# Patient Record
Sex: Female | Born: 1960 | Race: White | Hispanic: No | Marital: Married | Smoking: Former smoker
Health system: Southern US, Community
[De-identification: ages and names within clinical notes are randomized; demographics above are authoritative.]

## PROBLEM LIST (undated history)

## (undated) ENCOUNTER — Emergency Department (HOSPITAL_COMMUNITY): Discharge status: Absent without leave | Payer: Self-pay

## (undated) ENCOUNTER — Emergency Department (HOSPITAL_COMMUNITY): Admission: EM | Payer: Self-pay | Source: Home / Self Care

## (undated) DIAGNOSIS — N200 Calculus of kidney: Secondary | ICD-10-CM

## (undated) DIAGNOSIS — E669 Obesity, unspecified: Secondary | ICD-10-CM

## (undated) DIAGNOSIS — M779 Enthesopathy, unspecified: Secondary | ICD-10-CM

## (undated) DIAGNOSIS — I10 Essential (primary) hypertension: Secondary | ICD-10-CM

## (undated) DIAGNOSIS — R252 Cramp and spasm: Secondary | ICD-10-CM

## (undated) DIAGNOSIS — E7800 Pure hypercholesterolemia, unspecified: Secondary | ICD-10-CM

## (undated) DIAGNOSIS — K219 Gastro-esophageal reflux disease without esophagitis: Secondary | ICD-10-CM

## (undated) HISTORY — DX: Gastro-esophageal reflux disease without esophagitis: K21.9

## (undated) HISTORY — DX: Pure hypercholesterolemia, unspecified: E78.00

## (undated) HISTORY — PX: TUBAL LIGATION: SHX77

## (undated) HISTORY — DX: Calculus of kidney: N20.0

## (undated) HISTORY — DX: Enthesopathy, unspecified: M77.9

## (undated) HISTORY — PX: WISDOM TOOTH EXTRACTION: SHX21

---

## 2012-08-03 ENCOUNTER — Ambulatory Visit: Payer: BC Managed Care – PPO

## 2012-08-03 ENCOUNTER — Ambulatory Visit: Payer: BC Managed Care – PPO | Admitting: Family Medicine

## 2012-08-03 VITALS — BP 152/94 | HR 80 | Temp 98.1°F | Resp 16 | Ht 66.0 in | Wt 207.0 lb

## 2012-08-03 DIAGNOSIS — R1031 Right lower quadrant pain: Secondary | ICD-10-CM

## 2012-08-03 DIAGNOSIS — M549 Dorsalgia, unspecified: Secondary | ICD-10-CM

## 2012-08-03 DIAGNOSIS — N390 Urinary tract infection, site not specified: Secondary | ICD-10-CM

## 2012-08-03 LAB — POCT URINALYSIS DIPSTICK
Glucose, UA: NEGATIVE
Ketones, UA: 15
Nitrite, UA: NEGATIVE
Protein, UA: 300
Spec Grav, UA: >=1.03
Urobilinogen, UA: 0.2
pH, UA: 5.5

## 2012-08-03 LAB — POCT CBC
Granulocyte percent: 62.1 % (ref 37–80)
HCT, POC: 47.8 % (ref 37.7–47.9)
Hemoglobin: 15.1 g/dL (ref 12.2–16.2)
Lymph, poc: 4.1 — AB (ref 0.6–3.4)
MCH, POC: 30 pg (ref 27–31.2)
MCHC: 31.6 g/dL — AB (ref 31.8–35.4)
MCV: 94.9 fL (ref 80–97)
MID (cbc): 1 — AB (ref 0–0.9)
MPV: 8.7 fL (ref 0–99.8)
POC Granulocyte: 8.4 — AB (ref 2–6.9)
POC LYMPH PERCENT: 30.2 %L (ref 10–50)
POC MID %: 7.7 % (ref 0–12)
Platelet Count, POC: 402 10*3/uL (ref 142–424)
RBC: 5.04 M/uL (ref 4.04–5.48)
RDW, POC: 13.2 %
WBC: 13.6 10*3/uL — AB (ref 4.6–10.2)

## 2012-08-03 LAB — POCT UA - MICROSCOPIC ONLY
Amorphous: POSITIVE
Casts, Ur, LPF, POC: NEGATIVE
Crystals, Ur, HPF, POC: NEGATIVE
Mucus, UA: POSITIVE
Yeast, UA: NEGATIVE

## 2012-08-03 MED ORDER — OXYCODONE-ACETAMINOPHEN 5-325 MG PO TABS: 1.0000 | ORAL_TABLET | Freq: Three times a day (TID) | ORAL | Status: DC | PRN

## 2012-08-03 MED ORDER — IBUPROFEN 800 MG PO TABS: 800.0000 mg | ORAL_TABLET | Freq: Three times a day (TID) | ORAL | Status: DC | PRN

## 2012-08-03 MED ORDER — CIPROFLOXACIN HCL 500 MG PO TABS: 500.0000 mg | ORAL_TABLET | Freq: Two times a day (BID) | ORAL | Status: DC

## 2012-08-03 NOTE — Progress Notes (Signed)
[facility]:  Office Visit  Chief Complaint:  Chief Complaint  Patient presents with  . Flank Pain    Lt sided- all began yesterday- took ibuprofen about 3am and 10am today  . Abdominal Pain    across low abdomen  . Emesis    HPI: Gloria Martin is a 52 y.o. female who complains of  Left side back pain, low abdominal pain . Used scalding hot water in bath tub and laying on heating pad. YEsterday morning. Emesis. She works around [employer]. Deneis diarrhea. Pure clear liquid. Took IBuprofen with some relief. Deneis fevers, hematuria, melena. Feels like menstrual camps. LMP currently. Feels regular period. Occasionally will pass clots. August, Spet, OCtober did not have menses.   History reviewed. No pertinent past medical history. Past Surgical History  Procedure Date  . Tubal ligation    History   Social History  . Marital Status: Unknown    Spouse Name: N/A    Number of Children: N/A  . Years of Education: N/A   Social History Main Topics  . Smoking status: Current Every Day Smoker  . Smokeless tobacco: None  . Alcohol Use: No  . Drug Use: No  . Sexually Active: None   Other Topics Concern  . None   Social History Narrative  . None   Family History  Problem Relation Age of Onset  . Hypertension Mother   . Hypertension Brother    No Known Allergies Prior to Admission medications   Not on File     ROS: The patient denies fevers, chills, night sweats, unintentional weight loss, chest pain, palpitations, wheezing, dyspnea on exertion,  dysuria, hematuria, melena, numbness, weakness, or tingling.  All other systems have been reviewed and were otherwise negative with the exception of those mentioned in the HPI and as above.    PHYSICAL EXAM: Filed Vitals:   08/03/12 1432  BP: 152/94  Pulse: 80  Temp: 98.1 F (36.7 C)  Resp: 16   Filed Vitals:   08/03/12 1432  Height: 5\' 6"  (1.676 m)  Weight: 207 lb (93.895 kg)   Body mass index is  33.41 kg/(m^2).  General: Alert, no acute distress HEENT:  Normocephalic, atraumatic, oropharynx patent.  Cardiovascular:  Regular rate and rhythm, no rubs murmurs or gallops.  No Carotid bruits, radial pulse intact. No pedal edema.  Respiratory: Clear to auscultation bilaterally.  No wheezes, rales, or rhonchi.  No cyanosis, no use of accessory musculature GI: No organomegaly, abdomen is soft and mild left side abd-tender, positive bowel sounds.  No masses. Skin: No rashes. Neurologic: Facial musculature symmetric. Psychiatric: Patient is appropriate throughout our interaction. Lymphatic: No cervical lymphadenopathy Musculoskeletal: Gait intact. No Cva tenderness,    LABS: Results for orders placed in visit on 08/03/12  POCT CBC      Component Value Range   WBC 13.6 (*) 4.6 - 10.2 K/uL   Lymph, poc 4.1 (*) 0.6 - 3.4   POC LYMPH PERCENT 30.2  10 - 50 %L   MID (cbc) 1.0 (*) 0 - 0.9   POC MID % 7.7  0 - 12 %M   POC Granulocyte 8.4 (*) 2 - 6.9   Granulocyte percent 62.1  37 - 80 %G   RBC 5.04  4.04 - 5.48 M/uL   Hemoglobin 15.1  12.2 - 16.2 g/dL   HCT, POC 14.7  82.9 - 47.9 %   MCV 94.9  80 - 97 fL   MCH, POC 30.0  27 -  31.2 pg   MCHC 31.6 (*) 31.8 - 35.4 g/dL   RDW, POC 40.9     Platelet Count, POC 402  142 - 424 K/uL   MPV 8.7  0 - 99.8 fL  POCT UA - MICROSCOPIC ONLY      Component Value Range   WBC, Ur, HPF, POC 2-11     RBC, urine, microscopic tntc     Bacteria, U Microscopic 1+     Mucus, UA pos     Epithelial cells, urine per micros 2-6     Crystals, Ur, HPF, POC neg     Casts, Ur, LPF, POC neg     Yeast, UA neg     Amorphous pos    POCT URINALYSIS DIPSTICK      Component Value Range   Color, UA orange     Clarity, UA cloudy     Glucose, UA neg     Bilirubin, UA mod     Ketones, UA 15     Spec Grav, UA >=1.030     Blood, UA large     pH, UA 5.5     Protein, UA 300     Urobilinogen, UA 0.2     Nitrite, UA neg     Leukocytes, UA Trace       EKG/XRAY:     Primary read interpreted by Dr. Conley Rolls at [facility]. No obstruction/no free air, ? Renal stone   ASSESSMENT/PLAN: Encounter Diagnoses  Name Primary?  . Back pain Yes  . Abdominal pain, acute, bilateral lower quadrant   . UTI (lower urinary tract infection)    Renal stone vs UTI vs possibly combination of both. She is having left sided flank pain but there is a possible right sided renal stone Rx Cipro 500 mg BID x 10 Days, urine cx Rx Ibuprofen 800 mg q8 hrs with food prn Wrote rx for percocet 5/325 mg #20.  Push fluids F/u prn    Iviana Blasingame PHUONG, DO 08/03/2012 4:01 PM

## 2012-08-04 LAB — URINE CULTURE: Colony Count: >=100000

## 2012-10-02 ENCOUNTER — Telehealth: Payer: Self-pay

## 2012-10-02 NOTE — Telephone Encounter (Signed)
Spoke with patient and explained the labs and why she received a bill from [facility]. Patient stated that she has already paid the bill and that her insurance covered everything but $20. Patient satisfied with the explanation.

## 2013-10-15 ENCOUNTER — Encounter (HOSPITAL_COMMUNITY): Payer: Self-pay | Admitting: Emergency Medicine

## 2013-10-15 ENCOUNTER — Emergency Department (HOSPITAL_COMMUNITY): Payer: 59

## 2013-10-15 ENCOUNTER — Emergency Department (HOSPITAL_COMMUNITY)
Admission: EM | Admit: 2013-10-15 | Discharge: 2013-10-15 | Disposition: A | Discharge status: Home / Self Care | Payer: 59 | Attending: Emergency Medicine | Admitting: Emergency Medicine

## 2013-10-15 DIAGNOSIS — R1011 Right upper quadrant pain: Secondary | ICD-10-CM | POA: Insufficient documentation

## 2013-10-15 DIAGNOSIS — F17200 Nicotine dependence, unspecified, uncomplicated: Secondary | ICD-10-CM | POA: Insufficient documentation

## 2013-10-15 DIAGNOSIS — R6510 Systemic inflammatory response syndrome (SIRS) of non-infectious origin without acute organ dysfunction: Secondary | ICD-10-CM | POA: Insufficient documentation

## 2013-10-15 DIAGNOSIS — R109 Unspecified abdominal pain: Secondary | ICD-10-CM

## 2013-10-15 DIAGNOSIS — R112 Nausea with vomiting, unspecified: Secondary | ICD-10-CM | POA: Insufficient documentation

## 2013-10-15 DIAGNOSIS — R197 Diarrhea, unspecified: Secondary | ICD-10-CM | POA: Insufficient documentation

## 2013-10-15 LAB — CBC WITH DIFFERENTIAL/PLATELET
Basophils Absolute: 0 10*3/uL (ref 0.0–0.1)
Basophils Relative: 0 % (ref 0–1)
Eosinophils Absolute: 0 10*3/uL (ref 0.0–0.7)
Eosinophils Relative: 0 % (ref 0–5)
HEMATOCRIT: 45.6 % (ref 36.0–46.0)
Hemoglobin: 16.4 g/dL — ABNORMAL HIGH (ref 12.0–15.0)
LYMPHS PCT: 9 % — AB (ref 12–46)
Lymphs Abs: 2.1 10*3/uL (ref 0.7–4.0)
MCH: 31.8 pg (ref 26.0–34.0)
MCHC: 36 g/dL (ref 30.0–36.0)
MCV: 88.4 fL (ref 78.0–100.0)
MONO ABS: 0.8 10*3/uL (ref 0.1–1.0)
MONOS PCT: 3 % (ref 3–12)
NEUTROS ABS: 19.6 10*3/uL — AB (ref 1.7–7.7)
Neutrophils Relative %: 87 % — ABNORMAL HIGH (ref 43–77)
Platelets: 336 10*3/uL (ref 150–400)
RBC: 5.16 MIL/uL — ABNORMAL HIGH (ref 3.87–5.11)
RDW: 12.6 % (ref 11.5–15.5)
WBC: 22.5 10*3/uL — AB (ref 4.0–10.5)

## 2013-10-15 LAB — URINE MICROSCOPIC-ADD ON

## 2013-10-15 LAB — COMPREHENSIVE METABOLIC PANEL
ALT: 19 U/L (ref 0–35)
AST: 23 U/L (ref 0–37)
Albumin: 4.1 g/dL (ref 3.5–5.2)
Alkaline Phosphatase: 76 U/L (ref 39–117)
BILIRUBIN TOTAL: 0.7 mg/dL (ref 0.3–1.2)
BUN: 19 mg/dL (ref 6–23)
CHLORIDE: 98 meq/L (ref 96–112)
CO2: 19 meq/L (ref 19–32)
CREATININE: 0.68 mg/dL (ref 0.50–1.10)
Calcium: 10.1 mg/dL (ref 8.4–10.5)
GFR calc Af Amer: >90 mL/min (ref >90)
GFR calc non Af Amer: >90 mL/min (ref >90)
Glucose, Bld: 160 mg/dL — ABNORMAL HIGH (ref 70–99)
Potassium: 4.2 mEq/L (ref 3.7–5.3)
Sodium: 139 mEq/L (ref 137–147)
Total Protein: 7.5 g/dL (ref 6.0–8.3)

## 2013-10-15 LAB — URINALYSIS, ROUTINE W REFLEX MICROSCOPIC
Bilirubin Urine: NEGATIVE
Glucose, UA: NEGATIVE mg/dL
Ketones, ur: 40 mg/dL — AB
LEUKOCYTES UA: NEGATIVE
Nitrite: NEGATIVE
PROTEIN: NEGATIVE mg/dL
Specific Gravity, Urine: 1.02 (ref 1.005–1.030)
Urobilinogen, UA: 0.2 mg/dL (ref 0.0–1.0)
pH: 6.5 (ref 5.0–8.0)

## 2013-10-15 LAB — LIPASE, BLOOD: LIPASE: 29 U/L (ref 11–59)

## 2013-10-15 MED ORDER — DICYCLOMINE HCL 20 MG PO TABS: 20.0000 mg | ORAL_TABLET | Freq: Two times a day (BID) | ORAL | Status: DC

## 2013-10-15 MED ORDER — ONDANSETRON HCL 4 MG PO TABS: 4.0000 mg | ORAL_TABLET | Freq: Four times a day (QID) | ORAL | Status: DC

## 2013-10-15 MED ORDER — DICYCLOMINE HCL 10 MG/ML IM SOLN: 20.0000 mg | Freq: Once | INTRAMUSCULAR | Status: DC

## 2013-10-15 MED ORDER — ONDANSETRON HCL 4 MG/2ML IJ SOLN
4.0000 mg | Freq: Once | INTRAMUSCULAR | Status: AC
  Administered 2013-10-15: 4 mg via INTRAVENOUS
  Filled 2013-10-15: qty 2

## 2013-10-15 MED ORDER — DICYCLOMINE HCL 10 MG PO CAPS
10.0000 mg | ORAL_CAPSULE | Freq: Once | ORAL | Status: AC
  Administered 2013-10-15: 10 mg via ORAL
  Filled 2013-10-15: qty 1

## 2013-10-15 MED ORDER — SODIUM CHLORIDE 0.9 % IV BOLUS (SEPSIS)
1000.0000 mL | Freq: Once | INTRAVENOUS | Status: AC
  Administered 2013-10-15: 1000 mL via INTRAVENOUS

## 2013-10-15 MED ORDER — FAMOTIDINE IN NACL 20-0.9 MG/50ML-% IV SOLN
20.0000 mg | Freq: Once | INTRAVENOUS | Status: AC
  Administered 2013-10-15: 20 mg via INTRAVENOUS
  Filled 2013-10-15: qty 50

## 2013-10-15 NOTE — ED Notes (Signed)
Pt provided sprite 

## 2013-10-15 NOTE — ED Notes (Signed)
Pt. reports generalized abdominal pain with nausea and vomitting onset yesterday morning .

## 2013-10-15 NOTE — ED Provider Notes (Signed)
CSN: [id number]     Arrival date & time 10/15/13  0239 History   First MD Initiated Contact with Patient 10/15/13 [phone]     Chief Complaint  Patient presents with  . Abdominal Pain     (Consider location/radiation/quality/duration/timing/severity/associated sxs/prior Treatment) HPI  Please note that this is a late entry. This patient is a 53 year old woman who presents with about 24 hours of generalized abdominal cramping pain with nausea, vomiting and diarrhea. Patient first had vomiting. She estimates she has vomited 10 times or less 24 hours. The vomiting has subsided and she has developed diarrhea. She has had 3 diarrhea stools. Her pain now localizes to the epigastric region. It is aching, mild to moderate in severity. Non radiating. No GU sx. Pain is worse preceding episodes of vomiting or diarrhea. No known ill contacts.   History reviewed. No pertinent past medical history. Past Surgical History  Procedure Laterality Date  . Tubal ligation     Family History  Problem Relation Age of Onset  . Hypertension Mother   . Hypertension Brother    History  Substance Use Topics  . Smoking status: Current Every Day Smoker  . Smokeless tobacco: Not on file  . Alcohol Use: No   OB History   Grav Para Term Preterm Abortions TAB SAB Ect Mult Living                 Review of Systems  Ten point review of symptoms performed and is negative with the exception of symptoms noted above.   Allergies  Review of patient's allergies indicates no known allergies.  Home Medications   Current Outpatient Rx  Name  Route  Sig  Dispense  Refill  . ibuprofen (ADVIL,MOTRIN) 200 MG tablet   Oral   Take 400-800 mg by mouth every 6 (six) hours as needed for moderate pain.         Marland Kitchen. ibuprofen (ADVIL,MOTRIN) 800 MG tablet   Oral   Take 400-800 mg by mouth every 8 (eight) hours as needed for mild pain.          BP 131/71  Pulse 86  Temp(Src) 98.2 F (36.8 C) (Oral)  Resp 18  SpO2  100% Physical Exam Gen: well developed and well nourished appearing Head: NCAT Eyes: PERL, EOMI Nose: no epistaixis or rhinorrhea Mouth/throat: mucosa is moist and pink Neck: supple, no stridor Lungs: CTA B, no wheezing, rhonchi or rales CV: RRR, no murmur, extremities appear well perfused.  Abd: soft, mildly tender midline epigastrium, no peritoneal signs, nondistended Back: no ttp, no cva ttp Skin: warm and dry Ext: normal to inspection, no dependent edema Neuro: CN ii-xii grossly intact, no focal deficits Psyche; normal affect,  calm and cooperative.   ED Course  Procedures (including critical care time) Labs Review  Results for orders placed during the hospital encounter of 10/15/13 (from the past 24 hour(s))  CBC WITH DIFFERENTIAL     Status: Abnormal   Collection Time    10/15/13  2:46 AM      Result Value Ref Range   WBC 22.5 (*) 4.0 - 10.5 K/uL   RBC 5.16 (*) 3.87 - 5.11 MIL/uL   Hemoglobin 16.4 (*) 12.0 - 15.0 g/dL   HCT 11.945.6  14.736.0 - 82.946.0 %   MCV 88.4  78.0 - 100.0 fL   MCH 31.8  26.0 - 34.0 pg   MCHC 36.0  30.0 - 36.0 g/dL   RDW 56.212.6  13.011.5 - 86.515.5 %  Platelets 336  150 - 400 K/uL   Neutrophils Relative % 87 (*) 43 - 77 %   Neutro Abs 19.6 (*) 1.7 - 7.7 K/uL   Lymphocytes Relative 9 (*) 12 - 46 %   Lymphs Abs 2.1  0.7 - 4.0 K/uL   Monocytes Relative 3  3 - 12 %   Monocytes Absolute 0.8  0.1 - 1.0 K/uL   Eosinophils Relative 0  0 - 5 %   Eosinophils Absolute 0.0  0.0 - 0.7 K/uL   Basophils Relative 0  0 - 1 %   Basophils Absolute 0.0  0.0 - 0.1 K/uL  COMPREHENSIVE METABOLIC PANEL     Status: Abnormal   Collection Time    10/15/13  2:46 AM      Result Value Ref Range   Sodium 139  137 - 147 mEq/L   Potassium 4.2  3.7 - 5.3 mEq/L   Chloride 98  96 - 112 mEq/L   CO2 19  19 - 32 mEq/L   Glucose, Bld 160 (*) 70 - 99 mg/dL   BUN 19  6 - 23 mg/dL   Creatinine, Ser 6.14  0.50 - 1.10 mg/dL   Calcium 43.1  8.4 - 54.0 mg/dL   Total Protein 7.5  6.0 - 8.3 g/dL    Albumin 4.1  3.5 - 5.2 g/dL   AST 23  0 - 37 U/L   ALT 19  0 - 35 U/L   Alkaline Phosphatase 76  39 - 117 U/L   Total Bilirubin 0.7  0.3 - 1.2 mg/dL   GFR calc non Af Amer >90  >90 mL/min   GFR calc Af Amer >90  >90 mL/min  LIPASE, BLOOD     Status: None   Collection Time    10/15/13  2:46 AM      Result Value Ref Range   Lipase 29  11 - 59 U/L  URINALYSIS, ROUTINE W REFLEX MICROSCOPIC     Status: Abnormal   Collection Time    10/15/13  4:39 AM      Result Value Ref Range   Color, Urine YELLOW  YELLOW   APPearance CLOUDY (*) CLEAR   Specific Gravity, Urine 1.020  1.005 - 1.030   pH 6.5  5.0 - 8.0   Glucose, UA NEGATIVE  NEGATIVE mg/dL   Hgb urine dipstick SMALL (*) NEGATIVE   Bilirubin Urine NEGATIVE  NEGATIVE   Ketones, ur 40 (*) NEGATIVE mg/dL   Protein, ur NEGATIVE  NEGATIVE mg/dL   Urobilinogen, UA 0.2  0.0 - 1.0 mg/dL   Nitrite NEGATIVE  NEGATIVE   Leukocytes, UA NEGATIVE  NEGATIVE  URINE MICROSCOPIC-ADD ON     Status: Abnormal   Collection Time    10/15/13  4:39 AM      Result Value Ref Range   Squamous Epithelial / LPF FEW (*) RARE   WBC, UA 3-6  <3 WBC/hpf   RBC / HPF 3-6  <3 RBC/hpf   Bacteria, UA RARE  RARE     Imaging Review US Abdomen Limited  10/15/2013   CLINICAL DATA:  Right upper quadrant abdominal pain. Elevated white blood cell count.  EXAM: US ABDOMEN LIMITED - RIGHT UPPER QUADRANT  COMPARISON:  No priors.  FINDINGS: Gallbladder:  No gallstones or wall thickening visualized. No sonographic Murphy sign noted.  Common bile duct:  Diameter: 5 mm in the porta hepatis.  Liver:  No focal lesion identified. Within normal limits in parenchymal echogenicity.  IMPRESSION: 1.  No acute findings to account for the patient's symptoms. Specifically, no evidence of cholelithiasis or findings to suggest acute cholecystitis at this time.   Electronically Signed   By: Trudie Reed M.D.   On: 10/15/2013 06:19      MDM   DDX: acute pancreatitis, gall bladder disease,  PUD, GERD, colitis, ibs, ibd  Patient re-evaluated at 0700. Says pain has improved to 2/10 and declines any further pain medication. We will po challenge and, with that, will give po Bentyl.  The patient's repeat abdominal exam is benign. No signs or sx of obstruction. No peritoneal signs and no ttp over the lower abdomen. I do not believe that CT scan is indicated at this time as the patient's sx are consistent with viral gastroenteritis which is currently epidemic in this community and the patient has responded very well to treatment for this process.     Brandt Loosen, MD 10/15/13 [phone]

## 2013-10-15 NOTE — ED Notes (Signed)
Pt tolerating sprite without problem.

## 2013-10-15 NOTE — ED Notes (Signed)
Patient transported to Ultrasound 

## 2013-10-15 NOTE — Discharge Instructions (Signed)
Abdominal Pain, Adult °Many things can cause abdominal pain. Usually, abdominal pain is not caused by a disease and will improve without treatment. It can often be observed and treated at home. Your health care provider will do a physical exam and possibly order blood tests and X-rays to help determine the seriousness of your pain. However, in many cases, more time must pass before a clear cause of the pain can be found. Before that point, your health care provider may not know if you need more testing or further treatment. °HOME CARE INSTRUCTIONS  °Monitor your abdominal pain for any changes. The following actions may help to alleviate any discomfort you are experiencing: °· Only take over-the-counter or prescription medicines as directed by your health care provider. °· Do not take laxatives unless directed to do so by your health care provider. °· Try a clear liquid diet (broth, tea, or water) as directed by your health care provider. Slowly move to a bland diet as tolerated. °SEEK MEDICAL CARE IF: °· You have unexplained abdominal pain. °· You have abdominal pain associated with nausea or diarrhea. °· You have pain when you urinate or have a bowel movement. °· You experience abdominal pain that wakes you in the night. °· You have abdominal pain that is worsened or improved by eating food. °· You have abdominal pain that is worsened with eating fatty foods. °SEEK IMMEDIATE MEDICAL CARE IF:  °· Your pain does not go away within 2 hours. °· You have a fever. °· You keep throwing up (vomiting). °· Your pain is felt only in portions of the abdomen, such as the right side or the left lower portion of the abdomen. °· You pass bloody or black tarry stools. °MAKE SURE YOU: °· Understand these instructions.   °· Will watch your condition.   °· Will get help right away if you are not doing well or get worse.   °Document Released: 04/19/2005 Document Revised: 04/30/2013 Document Reviewed: 03/19/2013 °[employer]® Patient  Information ©2014 [employer]. ° °

## 2013-10-30 ENCOUNTER — Ambulatory Visit (INDEPENDENT_AMBULATORY_CARE_PROVIDER_SITE_OTHER): Payer: 59 | Admitting: Family Medicine

## 2013-10-30 VITALS — BP 145/80 | HR 61 | Temp 98.0°F | Resp 16 | Ht 64.4 in | Wt 212.4 lb

## 2013-10-30 DIAGNOSIS — Z1211 Encounter for screening for malignant neoplasm of colon: Secondary | ICD-10-CM

## 2013-10-30 DIAGNOSIS — R5381 Other malaise: Secondary | ICD-10-CM

## 2013-10-30 DIAGNOSIS — M549 Dorsalgia, unspecified: Secondary | ICD-10-CM

## 2013-10-30 DIAGNOSIS — R319 Hematuria, unspecified: Secondary | ICD-10-CM

## 2013-10-30 DIAGNOSIS — Z0000 Encounter for general adult medical examination without abnormal findings: Secondary | ICD-10-CM

## 2013-10-30 DIAGNOSIS — Z13220 Encounter for screening for lipoid disorders: Secondary | ICD-10-CM

## 2013-10-30 DIAGNOSIS — R5383 Other fatigue: Secondary | ICD-10-CM

## 2013-10-30 LAB — POCT CBC
Granulocyte percent: 68.9 % (ref 37–80)
HCT, POC: 44.6 % (ref 37.7–47.9)
Hemoglobin: 14.4 g/dL (ref 12.2–16.2)
Lymph, poc: 2.8 (ref 0.6–3.4)
MCH, POC: 30.3 pg (ref 27–31.2)
MCHC: 32.3 g/dL (ref 31.8–35.4)
MCV: 93.9 fL (ref 80–97)
MID (cbc): 0.8 (ref 0–0.9)
MPV: 8.7 fL (ref 0–99.8)
POC Granulocyte: 8.1 — AB (ref 2–6.9)
POC LYMPH PERCENT: 24.3 % (ref 10–50)
POC MID %: 6.8 %M (ref 0–12)
Platelet Count, POC: 356 10*3/uL (ref 142–424)
RBC: 4.75 M/uL (ref 4.04–5.48)
RDW, POC: 13.1 %
WBC: 11.7 10*3/uL — AB (ref 4.6–10.2)

## 2013-10-30 LAB — POCT URINALYSIS DIPSTICK
Bilirubin, UA: NEGATIVE
Glucose, UA: NEGATIVE
Ketones, UA: 15
Leukocytes, UA: NEGATIVE
Nitrite, UA: NEGATIVE
Protein, UA: NEGATIVE
Spec Grav, UA: 1.02
Urobilinogen, UA: 0.2
pH, UA: 6

## 2013-10-30 LAB — POCT UA - MICROSCOPIC ONLY
Casts, Ur, LPF, POC: NEGATIVE
Crystals, Ur, HPF, POC: NEGATIVE
Yeast, UA: NEGATIVE

## 2013-10-30 MED ORDER — TRAMADOL HCL 50 MG PO TABS: 50.0000 mg | ORAL_TABLET | Freq: Three times a day (TID) | ORAL | Status: DC | PRN

## 2013-10-30 NOTE — Progress Notes (Signed)
Chief Complaint:  Chief Complaint  Patient presents with  . Annual Exam    HPI: Gloria Martin is a 53 y.o. female who is here for an annual visit. She has never had annual, She has never had colonscopy, mammogram Has not had pap smear since her daughter was 52 years old She has generalized fatigue, She has chronic back pain with flare ups.  G1L1 She has ahd abnormal vaginal bleeding  X 1 episode, October 31 2012 for 10 days But prior to that she had one menses  for 1 year and completely was menses free and  then 1 year later had a menstrual cycle She is a smoker Deneis any family history of uterine, ovarian, breast cancer, does doe SBE Denies any family history of colon cancer Was recently in ER 10/15/13 for abdominal pain which they dx as viral gastroeneteritis She had blood work and also US abd See US below:  10/15/2013 CLINICAL DATA: Right upper quadrant abdominal pain. Elevated white blood cell count. EXAM: US ABDOMEN LIMITED - RIGHT UPPER QUADRANT COMPARISON: No priors. FINDINGS: Gallbladder: No gallstones or wall thickening visualized. No sonographic Murphy sign noted. Common bile duct: Diameter: 5 mm in the porta hepatis. Liver: No focal lesion identified. Within normal limits in parenchymal echogenicity. IMPRESSION: 1. No acute findings to account for the patient's symptoms. Specifically, no evidence of cholelithiasis or findings to suggest acute cholecystitis at this time. Electronically Signed By: Trudie Reedaniel Entrikin M.D. On: 10/15/2013 06:19    History reviewed. No pertinent past medical history. Past Surgical History  Procedure Laterality Date  . Tubal ligation     History   Social History  . Marital Status: Unknown    Spouse Name: N/A    Number of Children: N/A  . Years of Education: N/A   Social History Main Topics  . Smoking status: Current Every Day Smoker  . Smokeless tobacco: None  . Alcohol Use: No  . Drug Use: No  . Sexual Activity: None   Other Topics  Concern  . None   Social History Narrative  . None   Family History  Problem Relation Age of Onset  . Hypertension Mother   . Hypertension Brother    No Known Allergies Prior to Admission medications   Not on File     ROS: The patient denies fevers, chills, night sweats, unintentional weight loss, chest pain, palpitations, wheezing, dyspnea on exertion, nausea, vomiting, abdominal pain, dysuria, hematuria, melena, numbness, weakness, or tingling.   All other systems have been reviewed and were otherwise negative with the exception of those mentioned in the HPI and as above.    PHYSICAL EXAM: Filed Vitals:   10/30/13 1710  BP: 145/80  Pulse: 61  Temp: 98 F (36.7 C)  Resp: 16   Filed Vitals:   10/30/13 1710  Height: 5' 4.4" (1.636 m)  Weight: 212 lb 6.4 oz (96.344 kg)   Body mass index is 36 kg/(m^2).  General: Alert, no acute distress HEENT:  Normocephalic, atraumatic, oropharynx patent. EOMI, PERRLA Cardiovascular:  Regular rate and rhythm, no rubs murmurs or gallops.  No Carotid bruits, radial pulse intact. No pedal edema.  Respiratory: Clear to auscultation bilaterally.  No wheezes, rales, or rhonchi.  No cyanosis, no use of accessory musculature GI: No organomegaly, abdomen is soft and non-tender, positive bowel sounds.  No masses. Skin: No rashes. Neurologic: Facial musculature symmetric. Psychiatric: Patient is appropriate throughout our interaction. Lymphatic: No cervical lymphadenopathy Musculoskeletal: Gait intact. Breast exam  nl   LABS: Results for orders placed in visit on 10/30/13  POCT CBC      Result Value Ref Range   WBC 11.7 (*) 4.6 - 10.2 K/uL   Lymph, poc 2.8  0.6 - 3.4   POC LYMPH PERCENT 24.3  10 - 50 %L   MID (cbc) 0.8  0 - 0.9   POC MID % 6.8  0 - 12 %M   POC Granulocyte 8.1 (*) 2 - 6.9   Granulocyte percent 68.9  37 - 80 %G   RBC 4.75  4.04 - 5.48 M/uL   Hemoglobin 14.4  12.2 - 16.2 g/dL   HCT, POC 16.1  09.6 - 47.9 %   MCV 93.9   80 - 97 fL   MCH, POC 30.3  27 - 31.2 pg   MCHC 32.3  31.8 - 35.4 g/dL   RDW, POC 04.5     Platelet Count, POC 356  142 - 424 K/uL   MPV 8.7  0 - 99.8 fL  POCT UA - MICROSCOPIC ONLY      Result Value Ref Range   WBC, Ur, HPF, POC 0-2     RBC, urine, microscopic 1-5     Bacteria, U Microscopic 2+     Mucus, UA small     Epithelial cells, urine per micros 1-4     Crystals, Ur, HPF, POC neg     Casts, Ur, LPF, POC neg     Yeast, UA neg    POCT URINALYSIS DIPSTICK      Result Value Ref Range   Color, UA yellow     Clarity, UA clear     Glucose, UA neg     Bilirubin, UA neg     Ketones, UA 15     Spec Grav, UA 1.020     Blood, UA tr-lysed     pH, UA 6.0     Protein, UA neg     Urobilinogen, UA 0.2     Nitrite, UA neg     Leukocytes, UA Negative       EKG/XRAY:   Primary read interpreted by Dr. Conley Rolls at [facility].   ASSESSMENT/PLAN: Encounter Diagnoses  Name Primary?  . Annual physical exam Yes  . Screening for hyperlipidemia   . Back pain   . Fatigue   . Hematuria    Leukocytosis from ER visit from 10/15/13 improved from 22--> 11, She is asymptomatic  Labs pending: LDL, TSH,  CMP, IFOBT (Hemosure will bring back when ready since declined rectal exam) Refer for colonoscopy and also mammogram Decline pap this visit since she is worried she has to pay for this even after I have assurred her that it will be coered by insurance, she is interestingly having a 1 time episode of vaginal bleeding after having no menstrual cycle for over 1 year.  Advise to return in 2 weeks after check with her insurance for vaginal exam with pap, recheck CBC and also hematuria.   Gross sideeffects, risk and benefits, and alternatives of medications d/w patient. Patient is aware that all medications have potential sideeffects and we are unable to predict every sideeffect or drug-drug interaction that may occur.  Lenell Antu, DO 10/30/2013 7:01 PM

## 2013-10-31 LAB — COMPREHENSIVE METABOLIC PANEL WITH GFR
AST: 17 U/L (ref 0–37)
Alkaline Phosphatase: 65 U/L (ref 39–117)
BUN: 17 mg/dL (ref 6–23)
Creat: 0.73 mg/dL (ref 0.50–1.10)
Total Bilirubin: 0.7 mg/dL (ref 0.2–1.2)

## 2013-10-31 LAB — COMPREHENSIVE METABOLIC PANEL
ALT: 18 U/L (ref 0–35)
Albumin: 4.2 g/dL (ref 3.5–5.2)
CO2: 27 mEq/L (ref 19–32)
Calcium: 9.5 mg/dL (ref 8.4–10.5)
Chloride: 102 mEq/L (ref 96–112)
Glucose, Bld: 91 mg/dL (ref 70–99)
Potassium: 4.7 mEq/L (ref 3.5–5.3)
Sodium: 138 mEq/L (ref 135–145)
Total Protein: 6.6 g/dL (ref 6.0–8.3)

## 2013-10-31 LAB — TSH: TSH: 0.879 u[IU]/mL (ref 0.350–4.500)

## 2013-10-31 LAB — LDL CHOLESTEROL, DIRECT: Direct LDL: 123 mg/dL — ABNORMAL HIGH

## 2014-03-11 ENCOUNTER — Ambulatory Visit (INDEPENDENT_AMBULATORY_CARE_PROVIDER_SITE_OTHER): Payer: Self-pay | Admitting: Family Medicine

## 2014-03-11 VITALS — BP 170/105 | HR 60 | Temp 99.1°F | Resp 18 | Ht 64.5 in | Wt 216.0 lb

## 2014-03-11 DIAGNOSIS — G43019 Migraine without aura, intractable, without status migrainosus: Secondary | ICD-10-CM

## 2014-03-11 MED ORDER — NALBUPHINE HCL 10 MG/ML IJ SOLN: 10.0000 mg | INTRAMUSCULAR | Status: DC | PRN

## 2014-03-11 MED ORDER — NALBUPHINE HCL 10 MG/ML IJ SOLN
10.0000 mg | Freq: Once | INTRAMUSCULAR | Status: AC
  Administered 2014-03-11: 10 mg via INTRAMUSCULAR

## 2014-03-11 NOTE — Progress Notes (Signed)
[facility] [address], [place] [address] [phone]  Date:  03/11/2014   Name:  Gloria Martin   DOB:  [birth year]   MRN:  [id number]  PCP:  No PCP Per Patient    Chief Complaint: Migraine   History of Present Illness:  Gloria Martin is a 53 y.o. very pleasant female patient who presents with the following:  Here today with a severe HA- "a migraine" that she noted this am upon awakening.  She was last here in April and noted to have a BP of 145/80.   She has been vomiting as is typical for her migraine headaches.  No aura.  She may get a migraine 3 or 4x a year.  These seem to run in her family, and she has had them for many years,   This does seem like a typical migraine to her but is not as bad as some that she has had.   She took 4 ibuprofen around noon today but she vomited these back up.    She is post- menopausal, and has a ride home from clinic today with her husband.    There are no active problems to display for this patient.   History reviewed. No pertinent past medical history.  Past Surgical History  Procedure Laterality Date  . Tubal ligation      History  Substance Use Topics  . Smoking status: Current Every Day Smoker  . Smokeless tobacco: Not on file  . Alcohol Use: No    Family History  Problem Relation Age of Onset  . Hypertension Mother   . Hypertension Brother     No Known Allergies  Medication list has been reviewed and updated.  Current Outpatient Prescriptions on File Prior to Visit  Medication Sig Dispense Refill  . traMADol (ULTRAM) 50 MG tablet Take 1 tablet (50 mg total) by mouth every 8 (eight) hours as needed for severe pain.  30 tablet  0   No current facility-administered medications on file prior to visit.    Review of Systems:  As per HPI- otherwise negative.   Physical Examination: Filed Vitals:   03/11/14 1444  BP: 178/114  Pulse: 60  Temp: 99.1 F (37.3 C)  Resp: 18   Filed Vitals:    03/11/14 1444  Height: 5' 4.5" (1.638 m)  Weight: 216 lb (97.977 kg)   Body mass index is 36.52 kg/(m^2). Ideal Body Weight: Weight in (lb) to have BMI = 25: 147.6  GEN: WDWN, NAD, Non-toxic, A & O x 3, obese, has been vomiting.  HEENT: Atraumatic, Normocephalic. Neck supple. No masses, No LAD.  Bilateral TM wnl, oropharynx normal.  PEERL,EOMI.   Ears and Nose: No external deformity. CV: RRR, No M/G/R. No JVD. No thrill. No extra heart sounds. PULM: CTA B, no wheezes, crackles, rhonchi. No retractions. No resp. distress. No accessory muscle use. ABD: S, NT, ND EXTR: No c/c/e NEURO Normal gait. Normal strength, sensation and DTR all extremities.  PSYCH: Normally interactive. Conversant. Not depressed or anxious appearing.  Calm demeanor.  Given IM nubain 10mg .   Assessment and Plan: Intractable migraine without aura and without status migrainosus - Plan: nalbuphine (NUBAIN) injection 10 mg, DISCONTINUED: nalbuphine (NUBAIN) injection 10 mg  Given a dose of nubain for her HA. Encouraged her to wait and rest for a little while so we can make sure that her BP goes down now and that her pain is relieved.  She was concerned that "the  longer I stay here the more my bill goes up."  I explained that this is not true and there will no additional charge for her to be observed for a time. However she chose to leave clinic.  She is not currently interested in imaging of her head.  Her husband did agree to keep a close eye on her and to take her for re-evaluation if her HA does not resolve.    Called to check on her at 8pm.  Her husband stated that she was resting comfortablly  Signed Abbe AmsterdamJessica Hanako Tipping, MD

## 2014-03-11 NOTE — Addendum Note (Signed)
Addended by: Abbe AmsterdamOPLAND, JESSICA C on: 03/11/2014 09:31 PM   Modules accepted: Orders, Medications

## 2014-03-11 NOTE — Patient Instructions (Signed)
I hope that you feel better!  Let us know if your headache does not go away.

## 2014-05-08 ENCOUNTER — Other Ambulatory Visit: Payer: Self-pay

## 2015-03-01 ENCOUNTER — Telehealth: Payer: Self-pay

## 2015-03-01 NOTE — Telephone Encounter (Deleted)
Dr. Pia Mau requesting order for 1 x for 1 week, then 2 x for 5 weeks incontinence rehab.  Pt had stroke 3 weeks ago.  [phone]

## 2015-05-11 NOTE — Telephone Encounter (Signed)
No msg °

## 2015-11-19 ENCOUNTER — Emergency Department (HOSPITAL_COMMUNITY)
Admission: EM | Admit: 2015-11-19 | Discharge: 2015-11-19 | Disposition: A | Discharge status: Home / Self Care | Payer: Self-pay | Attending: Emergency Medicine | Admitting: Emergency Medicine

## 2015-11-19 ENCOUNTER — Encounter (HOSPITAL_COMMUNITY): Payer: Self-pay | Admitting: *Deleted

## 2015-11-19 ENCOUNTER — Emergency Department (HOSPITAL_COMMUNITY): Payer: Self-pay

## 2015-11-19 DIAGNOSIS — J209 Acute bronchitis, unspecified: Secondary | ICD-10-CM | POA: Insufficient documentation

## 2015-11-19 DIAGNOSIS — B9789 Other viral agents as the cause of diseases classified elsewhere: Secondary | ICD-10-CM

## 2015-11-19 DIAGNOSIS — E669 Obesity, unspecified: Secondary | ICD-10-CM | POA: Insufficient documentation

## 2015-11-19 DIAGNOSIS — F17200 Nicotine dependence, unspecified, uncomplicated: Secondary | ICD-10-CM | POA: Insufficient documentation

## 2015-11-19 DIAGNOSIS — M549 Dorsalgia, unspecified: Secondary | ICD-10-CM | POA: Insufficient documentation

## 2015-11-19 DIAGNOSIS — J069 Acute upper respiratory infection, unspecified: Secondary | ICD-10-CM | POA: Insufficient documentation

## 2015-11-19 HISTORY — DX: Obesity, unspecified: E66.9

## 2015-11-19 LAB — BASIC METABOLIC PANEL
Anion gap: 13 (ref 5–15)
BUN: 12 mg/dL (ref 6–20)
CALCIUM: 9.2 mg/dL (ref 8.9–10.3)
CO2: 20 mmol/L — ABNORMAL LOW (ref 22–32)
Chloride: 105 mmol/L (ref 101–111)
Creatinine, Ser: 0.87 mg/dL (ref 0.44–1.00)
GFR calc Af Amer: >60 mL/min (ref >60)
GFR calc non Af Amer: >60 mL/min (ref >60)
GLUCOSE: 116 mg/dL — AB (ref 65–99)
Potassium: 4.1 mmol/L (ref 3.5–5.1)
Sodium: 138 mmol/L (ref 135–145)

## 2015-11-19 LAB — CBC
HCT: 44.9 % (ref 36.0–46.0)
Hemoglobin: 15.1 g/dL — ABNORMAL HIGH (ref 12.0–15.0)
MCH: 29.8 pg (ref 26.0–34.0)
MCHC: 33.6 g/dL (ref 30.0–36.0)
MCV: 88.6 fL (ref 78.0–100.0)
PLATELETS: 266 10*3/uL (ref 150–400)
RBC: 5.07 MIL/uL (ref 3.87–5.11)
RDW: 13.1 % (ref 11.5–15.5)
WBC: 5.6 10*3/uL (ref 4.0–10.5)

## 2015-11-19 LAB — I-STAT TROPONIN, ED: TROPONIN I, POC: 0.01 ng/mL (ref 0.00–0.08)

## 2015-11-19 MED ORDER — KETOROLAC TROMETHAMINE 30 MG/ML IJ SOLN
30.0000 mg | Freq: Once | INTRAMUSCULAR | Status: DC
  Filled 2015-11-19: qty 1

## 2015-11-19 MED ORDER — ALBUTEROL SULFATE HFA 108 (90 BASE) MCG/ACT IN AERS
2.0000 | INHALATION_SPRAY | Freq: Once | RESPIRATORY_TRACT | Status: AC
  Administered 2015-11-19: 2 via RESPIRATORY_TRACT
  Filled 2015-11-19: qty 6.7

## 2015-11-19 MED ORDER — ALBUTEROL SULFATE (2.5 MG/3ML) 0.083% IN NEBU
5.0000 mg | INHALATION_SOLUTION | Freq: Once | RESPIRATORY_TRACT | Status: AC
  Administered 2015-11-19: 5 mg via RESPIRATORY_TRACT
  Filled 2015-11-19: qty 6

## 2015-11-19 MED ORDER — BENZONATATE 100 MG PO CAPS: 100.0000 mg | ORAL_CAPSULE | Freq: Three times a day (TID) | ORAL | Status: DC | PRN

## 2015-11-19 MED ORDER — SODIUM CHLORIDE 0.9 % IV BOLUS (SEPSIS)
2000.0000 mL | Freq: Once | INTRAVENOUS | Status: AC
  Administered 2015-11-19: 2000 mL via INTRAVENOUS

## 2015-11-19 NOTE — ED Notes (Signed)
Pt reports flu like symptoms since Sunday, cough, bodyaches and chills. Also having sob and cramping. This am woke up with dizziness.

## 2015-11-19 NOTE — ED Provider Notes (Signed)
CSN: [id number]     Arrival date & time 11/19/15  4098 History   First MD Initiated Contact with Patient 11/19/15 [phone]     Chief Complaint  Patient presents with  . Shortness of Breath  . Dizziness     (Consider location/radiation/quality/duration/timing/severity/associated sxs/prior Treatment) HPI  55 year old female with no reported past medical history presents with cough, chest/back pain, chills, and body aches for the past 5 days. Patient feels short of breath as well. Significant other at the bedside heard wheezing last night. Patient smokes 5 cigarettes a day but denies asthma, COPD, or past history of bronchitis. No reported fevers. Woke up this morning and was dizzy, especially with standing. Chest pain and back pain occur when coughing. Has a dry cough. Also having nasal congestion. Thinks she picked up an illness at work. Has tried ibuprofen with no relief of her aches and pains.  Past Medical History  Diagnosis Date  . Obesity    Past Surgical History  Procedure Laterality Date  . Tubal ligation     Family History  Problem Relation Age of Onset  . Hypertension Mother   . Hypertension Brother    Social History  Substance Use Topics  . Smoking status: Current Every Day Smoker  . Smokeless tobacco: None  . Alcohol Use: No   OB History    No data available     Review of Systems  Constitutional: Positive for chills. Negative for fever.  Respiratory: Positive for cough, shortness of breath and wheezing.   Cardiovascular: Positive for chest pain.  Gastrointestinal: Negative for vomiting, abdominal pain and diarrhea.  Musculoskeletal: Positive for back pain.  Neurological: Positive for dizziness.  All other systems reviewed and are negative.     Allergies  Review of patient's allergies indicates no known allergies.  Home Medications   Prior to Admission medications   Not on File   BP 140/97 mmHg  Pulse 96  Temp(Src) 97.8 F (36.6 C) (Oral)  Resp 24   SpO2 97% Physical Exam  Constitutional: She is oriented to person, place, and time. She appears well-developed and well-nourished.  HENT:  Head: Normocephalic and atraumatic.  Right Ear: External ear normal.  Left Ear: External ear normal.  Nose: Nose normal.  Eyes: Right eye exhibits no discharge. Left eye exhibits no discharge.  Cardiovascular: Normal rate, regular rhythm and normal heart sounds.   Pulmonary/Chest: Effort normal. No accessory muscle usage. No respiratory distress. She has wheezes. She has rhonchi. She exhibits tenderness (sternal).  Coarse breath sounds diffusely with some expiratory wheezes  Abdominal: Soft. There is no tenderness.  Neurological: She is alert and oriented to person, place, and time.  Skin: Skin is warm and dry. She is not diaphoretic.  Nursing note and vitals reviewed.   ED Course  Procedures (including critical care time) Labs Review Labs Reviewed  BASIC METABOLIC PANEL - Abnormal; Notable for the following:    CO2 20 (*)    Glucose, Bld 116 (*)    All other components within normal limits  CBC - Abnormal; Notable for the following:    Hemoglobin 15.1 (*)    All other components within normal limits  I-STAT TROPOININ, ED    Imaging Review Dg Chest 2 View  11/19/2015  CLINICAL DATA:  Chest pain for 5 days EXAM: CHEST  2 VIEW COMPARISON:  08/03/2012 FINDINGS: Cardiac shadow is within normal limits. The lungs are well aerated bilaterally. Minimal left basilar atelectasis is seen. No bony abnormality is noted. IMPRESSION:  Minimal left basilar atelectasis. Electronically Signed   By: Alcide CleverMark  Lukens M.D.   On: 11/19/2015 08:35   I have personally reviewed and evaluated these images and lab results as part of my medical decision-making.   EKG Interpretation   Date/Time:  Friday November 19 2015 08:16:43 EDT Ventricular Rate:  107 PR Interval:  122 QRS Duration: 74 QT Interval:  348 QTC Calculation: 464 R Axis:   46 Text Interpretation:  Sinus  tachycardia Cannot rule out Anterior infarct ,  age undetermined Abnormal ECG No old tracing to compare Confirmed by  Saskia Simerson  MD, Georgetta Crafton ([id number]) on 11/19/2015 9:05:49 AM      MDM   Final diagnoses:  Viral upper respiratory tract infection with cough  Acute bronchitis, unspecified organism    Patient presents with 5 days of what is likely a viral illness. Has some coarse/wheezing breath sounds, given albuterol nebulizer. Given fluids for dizziness. Workup shows no obvious pneumonia and lab work is unremarkable besides mild low bicarbonate that is likely from dehydration. She feels better with IV fluids. Lung sounds somewhat better with albuterol. Will discharge with albuterol inhaler for as needed shortness of breath and cough. Also given a cough suppressant. Encouraged increased fluids at home. Discussed return precautions and need for a PCP for primary care.    Pricilla LovelessScott Eudora Guevarra, MD 11/19/15 1058

## 2015-11-19 NOTE — ED Notes (Signed)
Pt verbalized understanding of d/c instructions, prescriptions, and follow-up care. No further questions/concerns, VSS, assisted to lobby in wheelchair.  

## 2015-11-22 ENCOUNTER — Emergency Department (HOSPITAL_COMMUNITY)
Admission: EM | Admit: 2015-11-22 | Discharge: 2015-11-22 | Disposition: A | Discharge status: Home / Self Care | Payer: Self-pay | Attending: Emergency Medicine | Admitting: Emergency Medicine

## 2015-11-22 ENCOUNTER — Encounter (HOSPITAL_COMMUNITY): Payer: Self-pay | Admitting: Emergency Medicine

## 2015-11-22 DIAGNOSIS — E669 Obesity, unspecified: Secondary | ICD-10-CM | POA: Insufficient documentation

## 2015-11-22 DIAGNOSIS — J069 Acute upper respiratory infection, unspecified: Secondary | ICD-10-CM | POA: Insufficient documentation

## 2015-11-22 DIAGNOSIS — M545 Low back pain: Secondary | ICD-10-CM | POA: Insufficient documentation

## 2015-11-22 DIAGNOSIS — F17200 Nicotine dependence, unspecified, uncomplicated: Secondary | ICD-10-CM | POA: Insufficient documentation

## 2015-11-22 DIAGNOSIS — Z79899 Other long term (current) drug therapy: Secondary | ICD-10-CM | POA: Insufficient documentation

## 2015-11-22 LAB — CBC
HCT: 43.6 % (ref 36.0–46.0)
Hemoglobin: 14.9 g/dL (ref 12.0–15.0)
MCH: 30.3 pg (ref 26.0–34.0)
MCHC: 34.2 g/dL (ref 30.0–36.0)
MCV: 88.6 fL (ref 78.0–100.0)
Platelets: 265 10*3/uL (ref 150–400)
RBC: 4.92 MIL/uL (ref 3.87–5.11)
RDW: 12.8 % (ref 11.5–15.5)
WBC: 12.1 10*3/uL — ABNORMAL HIGH (ref 4.0–10.5)

## 2015-11-22 LAB — BASIC METABOLIC PANEL
Anion gap: 12 (ref 5–15)
BUN: 17 mg/dL (ref 6–20)
CHLORIDE: 106 mmol/L (ref 101–111)
CO2: 23 mmol/L (ref 22–32)
CREATININE: 0.91 mg/dL (ref 0.44–1.00)
Calcium: 9.4 mg/dL (ref 8.9–10.3)
GFR calc Af Amer: >60 mL/min (ref >60)
GFR calc non Af Amer: >60 mL/min (ref >60)
GLUCOSE: 122 mg/dL — AB (ref 65–99)
POTASSIUM: 4.2 mmol/L (ref 3.5–5.1)
Sodium: 141 mmol/L (ref 135–145)

## 2015-11-22 LAB — I-STAT TROPONIN, ED: TROPONIN I, POC: 0 ng/mL (ref 0.00–0.08)

## 2015-11-22 MED ORDER — AZITHROMYCIN 250 MG PO TABS: 250.0000 mg | ORAL_TABLET | Freq: Every day | ORAL | Status: DC

## 2015-11-22 MED ORDER — PREDNISONE 20 MG PO TABS
60.0000 mg | ORAL_TABLET | Freq: Once | ORAL | Status: AC
  Administered 2015-11-22: 60 mg via ORAL
  Filled 2015-11-22: qty 3

## 2015-11-22 MED ORDER — PREDNISONE 20 MG PO TABS: 20.0000 mg | ORAL_TABLET | Freq: Every day | ORAL | Status: DC

## 2015-11-22 MED ORDER — IPRATROPIUM-ALBUTEROL 0.5-2.5 (3) MG/3ML IN SOLN
3.0000 mL | Freq: Once | RESPIRATORY_TRACT | Status: AC
  Administered 2015-11-22: 3 mL via RESPIRATORY_TRACT
  Filled 2015-11-22: qty 3

## 2015-11-22 NOTE — ED Notes (Signed)
Pty oob to BR with steady gait

## 2015-11-22 NOTE — Discharge Instructions (Signed)
[name],  Nice meeting you! Please follow-up with your primary care provider. Return to the emergency department if you develop increased shortness of breath, chest pain, nausea/vomiting, new/worsening symptoms. Feel better soon!  S. Lane Hacker, PA-C Upper Respiratory Infection, Adult Most upper respiratory infections (URIs) are a viral infection of the air passages leading to the lungs. A URI affects the nose, throat, and upper air passages. The most common type of URI is nasopharyngitis and is typically referred to as "the common cold." URIs run their course and usually go away on their own. Most of the time, a URI does not require medical attention, but sometimes a bacterial infection in the upper airways can follow a viral infection. This is called a secondary infection. Sinus and middle ear infections are common types of secondary upper respiratory infections. Bacterial pneumonia can also complicate a URI. A URI can worsen asthma and chronic obstructive pulmonary disease (COPD). Sometimes, these complications can require emergency medical care and may be life threatening.  CAUSES Almost all URIs are caused by viruses. A virus is a type of germ and can spread from one person to another.  RISKS FACTORS You may be at risk for a URI if:   You smoke.   You have chronic heart or lung disease.  You have a weakened defense (immune) system.   You are very young or very old.   You have nasal allergies or asthma.  You work in crowded or poorly ventilated areas.  You work in health care facilities or schools. SIGNS AND SYMPTOMS  Symptoms typically develop 2-3 days after you come in contact with a cold virus. Most viral URIs last 7-10 days. However, viral URIs from the influenza virus (flu virus) can last 14-18 days and are typically more severe. Symptoms may include:   Runny or stuffy (congested) nose.   Sneezing.   Cough.   Sore throat.   Headache.   Fatigue.    Fever.   Loss of appetite.   Pain in your forehead, behind your eyes, and over your cheekbones (sinus pain).  Muscle aches.  DIAGNOSIS  Your health care provider may diagnose a URI by:  Physical exam.  Tests to check that your symptoms are not due to another condition such as:  Strep throat.  Sinusitis.  Pneumonia.  Asthma. TREATMENT  A URI goes away on its own with time. It cannot be cured with medicines, but medicines may be prescribed or recommended to relieve symptoms. Medicines may help:  Reduce your fever.  Reduce your cough.  Relieve nasal congestion. HOME CARE INSTRUCTIONS   Take medicines only as directed by your health care provider.   Gargle warm saltwater or take cough drops to comfort your throat as directed by your health care provider.  Use a warm mist humidifier or inhale steam from a shower to increase air moisture. This may make it easier to breathe.  Drink enough fluid to keep your urine clear or pale yellow.   Eat soups and other clear broths and maintain good nutrition.   Rest as needed.   Return to work when your temperature has returned to normal or as your health care provider advises. You may need to stay home longer to avoid infecting others. You can also use a face mask and careful hand washing to prevent spread of the virus.  Increase the usage of your inhaler if you have asthma.   Do not use any tobacco products, including cigarettes, chewing tobacco, or electronic  cigarettes. If you need help quitting, ask your health care provider. PREVENTION  The best way to protect yourself from getting a cold is to practice good hygiene.   Avoid oral or hand contact with people with cold symptoms.   Wash your hands often if contact occurs.  There is no clear evidence that vitamin C, vitamin E, echinacea, or exercise reduces the chance of developing a cold. However, it is always recommended to get plenty of rest, exercise, and  practice good nutrition.  SEEK MEDICAL CARE IF:   You are getting worse rather than better.   Your symptoms are not controlled by medicine.   You have chills.  You have worsening shortness of breath.  You have brown or red mucus.  You have yellow or brown nasal discharge.  You have pain in your face, especially when you bend forward.  You have a fever.  You have swollen neck glands.  You have pain while swallowing.  You have white areas in the back of your throat. SEEK IMMEDIATE MEDICAL CARE IF:   You have severe or persistent:  Headache.  Ear pain.  Sinus pain.  Chest pain.  You have chronic lung disease and any of the following:  Wheezing.  Prolonged cough.  Coughing up blood.  A change in your usual mucus.  You have a stiff neck.  You have changes in your:  Vision.  Hearing.  Thinking.  Mood. MAKE SURE YOU:   Understand these instructions.  Will watch your condition.  Will get help right away if you are not doing well or get worse.   This information is not intended to replace advice given to you by your health care provider. Make sure you discuss any questions you have with your health care provider.   Document Released: 01/03/2001 Document Revised: 11/24/2014 Document Reviewed: 10/15/2013 Elsevier Interactive Patient Education [employer]2016 [employer] Inc.

## 2015-11-22 NOTE — ED Notes (Signed)
Pt here with continued SOB since being seen here on Friday. Pt hyperventilating in triage, but speaking in complete sentences. Pt sts she was dx with upper respiratory infection. Pt sts "I need to lay down, I almost passed out at work today. You need to find me a bed or I am going to lay on the floor." Pt sat 100%.

## 2015-11-30 NOTE — ED Provider Notes (Signed)
CSN: [id number]     Arrival date & time 11/22/15  1218 History   First MD Initiated Contact with Patient 11/22/15 1322     Chief Complaint  Patient presents with  . Shortness of Breath   HPI Gloria Martin is a 55 y.o. female with no significant PMH presenting with an 8 day history of nonexertional SOB and nonproductive cough. She endorses chest pain and back pain when she coughs. She was seen in the ED on 4/28, was prescribed cough suppressant, albuterol inhaler, and she has been using these without relief. She admits to smoking daily. No fevers, chills, hemoptysis, abdominal pain, N/V, changes in bowel/bladder habits, recent surgeries, leg swelling/discolorations, history of CA.   Past Medical History  Diagnosis Date  . Obesity    Past Surgical History  Procedure Laterality Date  . Tubal ligation     Family History  Problem Relation Age of Onset  . Hypertension Mother   . Hypertension Brother    Social History  Substance Use Topics  . Smoking status: Current Every Day Smoker  . Smokeless tobacco: None  . Alcohol Use: No   OB History    No data available     Review of Systems  Ten systems are reviewed and are negative for acute change except as noted in the HPI  Allergies  Review of patient's allergies indicates no known allergies.  Home Medications   Prior to Admission medications   Medication Sig Start Date End Date Taking? Authorizing Provider  albuterol (PROVENTIL HFA;VENTOLIN HFA) 108 (90 Base) MCG/ACT inhaler Inhale 2 puffs into the lungs every 6 (six) hours as needed for wheezing or shortness of breath.   Yes Historical Provider, MD  aspirin-acetaminophen-caffeine (EXCEDRIN MIGRAINE) [id number] MG tablet Take 1 tablet by mouth every 6 (six) hours as needed for headache.   Yes Historical Provider, MD  benzonatate (TESSALON) 100 MG capsule Take 1 capsule (100 mg total) by mouth 3 (three) times daily as needed for cough. 11/19/15  Yes Pricilla Loveless, MD  azithromycin  (ZITHROMAX) 250 MG tablet Take 1 tablet (250 mg total) by mouth daily. Take first 2 tablets together, then 1 every day until finished. 11/22/15   Melton Krebs, PA-C  predniSONE (DELTASONE) 20 MG tablet Take 1 tablet (20 mg total) by mouth daily. 11/22/15   Melton Krebs, PA-C   BP 181/94 mmHg  Pulse 70  Temp(Src) 98.6 F (37 C) (Oral)  Resp 20  Ht  (1.778 m)  Wt 97.977 kg  BMI 30.99 kg/m2  SpO2 97% Physical Exam  Constitutional: She appears well-developed and well-nourished. No distress.  Obese  HENT:  Head: Normocephalic and atraumatic.  Mouth/Throat: Oropharynx is clear and moist. No oropharyngeal exudate.  Eyes: Conjunctivae are normal. Pupils are equal, round, and reactive to light. Right eye exhibits no discharge. Left eye exhibits no discharge. No scleral icterus.  Neck: No tracheal deviation present.  Cardiovascular: Normal rate, regular rhythm, normal heart sounds and intact distal pulses.  Exam reveals no gallop and no friction rub.   No murmur heard. Pulmonary/Chest: Effort normal. No respiratory distress. She has wheezes. She has no rales. She exhibits tenderness.  BL end expiratory wheezes. Midsternal chest tenderness  Abdominal: Soft. Bowel sounds are normal. She exhibits no distension and no mass. There is no tenderness. There is no rebound and no guarding.  Musculoskeletal: She exhibits no edema.  No midline thoracic tenderness  Lymphadenopathy:    She has no cervical adenopathy.  Neurological: She is  alert. Coordination normal.  Skin: Skin is warm and dry. No rash noted. She is not diaphoretic. No erythema.  Psychiatric: She has a normal mood and affect. Her behavior is normal.  Nursing note and vitals reviewed.   ED Course  Procedures  Labs Review Labs Reviewed  CBC - Abnormal; Notable for the following:    WBC 12.1 (*)    All other components within normal limits  BASIC METABOLIC PANEL - Abnormal; Notable for the following:    Glucose,  Bld 122 (*)    All other components within normal limits  I-STAT TROPOININ, ED    EKG Interpretation   Date/Time:  Monday Nov 22 2015 14:51:01 EDT Ventricular Rate:  66 PR Interval:  143 QRS Duration: 100 QT Interval:  452 QTC Calculation: 474 R Axis:   22 Text Interpretation:  Sinus rhythm Borderline T abnormalities, diffuse  leads T wave change new compared to November 19 2015 Confirmed by Criss AlvineGOLDSTON  MD, SCOTT [phone]([id number]) on 11/23/2015 1:48:00 PM      MDM   Final diagnoses:  Upper respiratory infection   Patient nontoxic appearing, VSS. On 4/28, patient's CXR demonstrated minimal left basilar atelectasis. No indication for further imaging today. EKG demonstrates borderline T abnormalities in diffuse leads but this is less likely ACS given CP is only pleuritic, is nonexertional, and patient has no PMH increasing her risk of this, other than smoking. PE less likely. Troponin today and last visit was unremarkable. CBC demonstrates nonspecific leukocytosis. CMP unremarkable. I feel this is lingering URI exacerbated by COPD/asthma.   O2 saturations maintained >90, no current signs of respiratory distress. Lung exam improved after nebulizer treatment. Prednisone given in the ED. Pt states they are breathing at baseline. Pt has been instructed to continue using prescribed medications and to speak with PCP about today's exacerbation. Patient given strict return precautions regarding CP and SOB. Patient may be safely discharged home. Patient was given albuterol inhaler last visit. Given patient is a smoker and demonstrated wheezing on exam, will send home with prednisone and z-pack. Discussed reasons for return. Patient to follow-up with primary care provider within one week. Patient in understanding and agreement with the plan.  Melton KrebsSamantha Nicole Bardia Wangerin, PA-C 11/30/15 1147  Benjiman CoreNathan Pickering, MD 12/01/15 [phone]

## 2016-08-04 ENCOUNTER — Encounter (HOSPITAL_COMMUNITY): Payer: Self-pay | Admitting: Emergency Medicine

## 2016-08-04 ENCOUNTER — Emergency Department (HOSPITAL_COMMUNITY): Payer: Self-pay

## 2016-08-04 ENCOUNTER — Emergency Department (HOSPITAL_COMMUNITY)
Admission: EM | Admit: 2016-08-04 | Discharge: 2016-08-04 | Disposition: A | Discharge status: Home / Self Care | Payer: Self-pay | Attending: Emergency Medicine | Admitting: Emergency Medicine

## 2016-08-04 DIAGNOSIS — Z7982 Long term (current) use of aspirin: Secondary | ICD-10-CM | POA: Insufficient documentation

## 2016-08-04 DIAGNOSIS — R0789 Other chest pain: Secondary | ICD-10-CM | POA: Insufficient documentation

## 2016-08-04 DIAGNOSIS — I10 Essential (primary) hypertension: Secondary | ICD-10-CM | POA: Insufficient documentation

## 2016-08-04 DIAGNOSIS — F17200 Nicotine dependence, unspecified, uncomplicated: Secondary | ICD-10-CM | POA: Insufficient documentation

## 2016-08-04 LAB — BASIC METABOLIC PANEL
ANION GAP: 9 (ref 5–15)
BUN: 13 mg/dL (ref 6–20)
CALCIUM: 9.4 mg/dL (ref 8.9–10.3)
CO2: 25 mmol/L (ref 22–32)
CREATININE: 0.9 mg/dL (ref 0.44–1.00)
Chloride: 105 mmol/L (ref 101–111)
GFR calc Af Amer: >60 mL/min (ref >60)
GLUCOSE: 118 mg/dL — AB (ref 65–99)
Potassium: 4.6 mmol/L (ref 3.5–5.1)
Sodium: 139 mmol/L (ref 135–145)

## 2016-08-04 LAB — CBC
HCT: 42 % (ref 36.0–46.0)
HEMOGLOBIN: 14.3 g/dL (ref 12.0–15.0)
MCH: 30.6 pg (ref 26.0–34.0)
MCHC: 34 g/dL (ref 30.0–36.0)
MCV: 89.7 fL (ref 78.0–100.0)
PLATELETS: 347 10*3/uL (ref 150–400)
RBC: 4.68 MIL/uL (ref 3.87–5.11)
RDW: 13.2 % (ref 11.5–15.5)
WBC: 9.4 10*3/uL (ref 4.0–10.5)

## 2016-08-04 LAB — I-STAT TROPONIN, ED
TROPONIN I, POC: 0 ng/mL (ref 0.00–0.08)
TROPONIN I, POC: 0 ng/mL (ref 0.00–0.08)

## 2016-08-04 NOTE — ED Provider Notes (Signed)
[facility]-EMERGENCY DEPT Provider Note   CSN: [id number] Arrival date & time: 08/04/16  1210     History   Chief Complaint Chief Complaint  Patient presents with  . Chills  . Chest Pain    HPI Gloria Martin is a 56 y.o. female.  HPI Patient developed anterior chest pain lasting a split-second 2 days ago coming by nausea and a feeling of needing to go to the bathroom. She has had similar pain in the past when her blood pressure was up. She also reports pain in her right flank and numbness around her mouth lasting 1 minute yesterday. She is presently asymptomatic. No treatment prior to coming here. No other associated symptoms. Other associated symptoms include cramping in both hands 2 days ago which has resolved and she feels as if her hands feel "tight." Presently Past Medical History:  Diagnosis Date  . Obesity    Hypertension There are no active problems to display for this patient.   Past Surgical History:  Procedure Laterality Date  . TUBAL LIGATION      OB History    No data available       Home Medications    Prior to Admission medications   Medication Sig Start Date End Date Taking? Authorizing Provider  albuterol (PROVENTIL HFA;VENTOLIN HFA) 108 (90 Base) MCG/ACT inhaler Inhale 2 puffs into the lungs every 6 (six) hours as needed for wheezing or shortness of breath.   Yes Historical Provider, MD  aspirin-acetaminophen-caffeine (EXCEDRIN MIGRAINE) [id number] MG tablet Take 1 tablet by mouth every 6 (six) hours as needed for headache.   Yes Historical Provider, MD  losartan (COZAAR) 100 MG tablet Take 100 mg by mouth daily. 05/29/16  Yes Historical Provider, MD  azithromycin (ZITHROMAX) 250 MG tablet Take 1 tablet (250 mg total) by mouth daily. Take first 2 tablets together, then 1 every day until finished. Patient not taking: Reported on 08/04/2016 11/22/15   Melton KrebsSamantha Nicole Riley, PA-C  benzonatate (TESSALON) 100 MG capsule Take 1 capsule (100 mg total) by mouth 3  (three) times daily as needed for cough. Patient not taking: Reported on 08/04/2016 11/19/15   Pricilla LovelessScott Goldston, MD  predniSONE (DELTASONE) 20 MG tablet Take 1 tablet (20 mg total) by mouth daily. Patient not taking: Reported on 08/04/2016 11/22/15   Melton KrebsSamantha Nicole Riley, PA-C    Family History Family History  Problem Relation Age of Onset  . Hypertension Mother   . Hypertension Brother    Brother had stents in his heart in his 5750s Social History Social History  Substance Use Topics  . Smoking status: Current Every Day Smoker  . Smokeless tobacco: Never Used  . Alcohol use No     Allergies   Patient has no known allergies.   Review of Systems Review of Systems  Cardiovascular: Positive for chest pain.  Neurological: Positive for numbness.       Numbness around her mouth lasting 1 minute  All other systems reviewed and are negative.    Physical Exam Updated Vital Signs BP 113/92   Pulse 73   Temp 98.1 F (36.7 C) (Oral)   Resp 13   SpO2 100%   Physical Exam  Constitutional: She is oriented to person, place, and time. She appears well-developed and well-nourished.  HENT:  Head: Normocephalic and atraumatic.  Eyes: Conjunctivae are normal. Pupils are equal, round, and reactive to light.  Neck: Neck supple. No tracheal deviation present. No thyromegaly present.  Cardiovascular: Normal rate and regular rhythm.  No murmur heard. Pulmonary/Chest: Effort normal and breath sounds normal.  Abdominal: Soft. Bowel sounds are normal. She exhibits no distension. There is no tenderness.  Musculoskeletal: Normal range of motion. She exhibits no edema or tenderness.  Neurological: She is alert and oriented to person, place, and time. Coordination normal.  Skin: Skin is warm and dry. No rash noted.  Psychiatric: She has a normal mood and affect.  Nursing note and vitals reviewed.    ED Treatments / Results  Labs (all labs ordered are listed, but only abnormal results are  displayed) Labs Reviewed  BASIC METABOLIC PANEL - Abnormal; Notable for the following:       Result Value   Glucose, Bld 118 (*)    All other components within normal limits  CBC  I-STAT TROPOININ, ED  I-STAT TROPOININ, ED    EKG  EKG Interpretation  Date/Time:  Friday August 04 2016 12:45:37 EST Ventricular Rate:  78 PR Interval:  140 QRS Duration: 78 QT Interval:  388 QTC Calculation: 442 R Axis:   0 Text Interpretation:  Normal sinus rhythm Minimal voltage criteria for LVH, may be normal variant Cannot rule out Anterior infarct , age undetermined Abnormal ECG No significant change since last tracing Confirmed by Ethelda Chick  MD, Clytee Heinrich [phone]) on 08/04/2016 7:41:52 PM      Results for orders placed or performed during the hospital encounter of 08/04/16  Basic metabolic panel  Result Value Ref Range   Sodium 139 135 - 145 mmol/L   Potassium 4.6 3.5 - 5.1 mmol/L   Chloride 105 101 - 111 mmol/L   CO2 25 22 - 32 mmol/L   Glucose, Bld 118 (H) 65 - 99 mg/dL   BUN 13 6 - 20 mg/dL   Creatinine, Ser 6.04 0.44 - 1.00 mg/dL   Calcium 9.4 8.9 - 54.0 mg/dL   GFR calc non Af Amer >60 >60 mL/min   GFR calc Af Amer >60 >60 mL/min   Anion gap 9 5 - 15  CBC  Result Value Ref Range   WBC 9.4 4.0 - 10.5 K/uL   RBC 4.68 3.87 - 5.11 MIL/uL   Hemoglobin 14.3 12.0 - 15.0 g/dL   HCT 98.1 19.1 - 47.8 %   MCV 89.7 78.0 - 100.0 fL   MCH 30.6 26.0 - 34.0 pg   MCHC 34.0 30.0 - 36.0 g/dL   RDW 29.5 62.1 - 30.8 %   Platelets 347 150 - 400 K/uL  I-stat troponin, ED  Result Value Ref Range   Troponin i, poc 0.00 0.00 - 0.08 ng/mL   Comment 3          I-stat troponin, ED  Result Value Ref Range   Troponin i, poc 0.00 0.00 - 0.08 ng/mL   Comment 3           Dg Chest 2 View  Result Date: 08/04/2016 CLINICAL DATA:  Pt c/o mid chest pain radiating around to left side of back; onset X2 days ago; hx HTN, smoker EXAM: CHEST  2 VIEW COMPARISON:  11/19/2015 FINDINGS: Cardiac silhouette is normal in  size and configuration. Normal mediastinal and hilar contours. Clear lungs.  No pleural effusion or pneumothorax. Mild chronic elevation right hemidiaphragm, stable. Skeletal structures are intact. IMPRESSION: No active cardiopulmonary disease. Electronically Signed   By: Amie Portland M.D.   On: 08/04/2016 13:29    Radiology Dg Chest 2 View  Result Date: 08/04/2016 CLINICAL DATA:  Pt c/o mid chest pain radiating around to left  side of back; onset X2 days ago; hx HTN, smoker EXAM: CHEST  2 VIEW COMPARISON:  11/19/2015 FINDINGS: Cardiac silhouette is normal in size and configuration. Normal mediastinal and hilar contours. Clear lungs.  No pleural effusion or pneumothorax. Mild chronic elevation right hemidiaphragm, stable. Skeletal structures are intact. IMPRESSION: No active cardiopulmonary disease. Electronically Signed   By: Amie Portland M.D.   On: 08/04/2016 13:29    Procedures Procedures (including critical care time)  Medications Ordered in ED Medications - No data to display   Initial Impression / Assessment and Plan / ED Course  I have reviewed the triage vital signs and the nursing notes.  Pertinent labs & imaging results that were available during my care of the patient were reviewed by me and considered in my medical decision making (see chart for details). 10 PM patient resting comfortably. Asymptomatic. Clinical Course    Chest pain highly atypical. Heart score 3. I counseled patient for 5 minutes on smoking cessation. Plan follow-up with primary care physician next week   Final Clinical Impressions(s) / ED Diagnoses  Diagnosis #1 atypical chest pain #2 tobacco abuse Final diagnoses:  None    New Prescriptions New Prescriptions   No medications on file     Doug Sou, MD 08/04/16 2204

## 2016-08-04 NOTE — ED Notes (Signed)
Pt stepping outside for a smoke.

## 2016-08-04 NOTE — ED Triage Notes (Signed)
Chills and sharp  pain in rt side and feels like her hand are swollen  Pain in chest x 2 days

## 2016-08-04 NOTE — Discharge Instructions (Signed)
Contact your primary care physician next week to schedule office appointment. You may need further evaluation of your chest pain as an outpatient. Return if your condition worsens for any reason. Ask your primary care physician to help you to stop smoking

## 2017-08-01 ENCOUNTER — Emergency Department (HOSPITAL_COMMUNITY): Payer: Self-pay

## 2017-08-01 ENCOUNTER — Encounter (HOSPITAL_COMMUNITY): Payer: Self-pay | Admitting: Emergency Medicine

## 2017-08-01 ENCOUNTER — Emergency Department (HOSPITAL_COMMUNITY)
Admission: EM | Admit: 2017-08-01 | Discharge: 2017-08-02 | Disposition: A | Discharge status: Home / Self Care | Payer: Self-pay | Attending: Emergency Medicine | Admitting: Emergency Medicine

## 2017-08-01 DIAGNOSIS — Z79899 Other long term (current) drug therapy: Secondary | ICD-10-CM | POA: Insufficient documentation

## 2017-08-01 DIAGNOSIS — F17210 Nicotine dependence, cigarettes, uncomplicated: Secondary | ICD-10-CM | POA: Insufficient documentation

## 2017-08-01 DIAGNOSIS — I10 Essential (primary) hypertension: Secondary | ICD-10-CM | POA: Insufficient documentation

## 2017-08-01 DIAGNOSIS — R42 Dizziness and giddiness: Secondary | ICD-10-CM | POA: Insufficient documentation

## 2017-08-01 DIAGNOSIS — R079 Chest pain, unspecified: Secondary | ICD-10-CM | POA: Insufficient documentation

## 2017-08-01 DIAGNOSIS — R0602 Shortness of breath: Secondary | ICD-10-CM | POA: Insufficient documentation

## 2017-08-01 HISTORY — DX: Cramp and spasm: R25.2

## 2017-08-01 HISTORY — DX: Essential (primary) hypertension: I10

## 2017-08-01 LAB — CBC
HCT: 41.7 % (ref 36.0–46.0)
Hemoglobin: 14.1 g/dL (ref 12.0–15.0)
MCH: 30.9 pg (ref 26.0–34.0)
MCHC: 33.8 g/dL (ref 30.0–36.0)
MCV: 91.2 fL (ref 78.0–100.0)
PLATELETS: 339 10*3/uL (ref 150–400)
RBC: 4.57 MIL/uL (ref 3.87–5.11)
RDW: 13 % (ref 11.5–15.5)
WBC: 12.3 10*3/uL — ABNORMAL HIGH (ref 4.0–10.5)

## 2017-08-01 LAB — BASIC METABOLIC PANEL
Anion gap: 8 (ref 5–15)
BUN: 17 mg/dL (ref 6–20)
CHLORIDE: 102 mmol/L (ref 101–111)
CO2: 27 mmol/L (ref 22–32)
CREATININE: 0.9 mg/dL (ref 0.44–1.00)
Calcium: 9.4 mg/dL (ref 8.9–10.3)
Glucose, Bld: 158 mg/dL — ABNORMAL HIGH (ref 65–99)
POTASSIUM: 3.7 mmol/L (ref 3.5–5.1)
SODIUM: 137 mmol/L (ref 135–145)

## 2017-08-01 LAB — I-STAT TROPONIN, ED: TROPONIN I, POC: 0 ng/mL (ref 0.00–0.08)

## 2017-08-01 LAB — MAGNESIUM: Magnesium: 2.1 mg/dL (ref 1.7–2.4)

## 2017-08-01 NOTE — ED Provider Notes (Signed)
Patient placed in Quick Look pathway, seen and evaluated for chief complaint of chest pain. Sent by PCP due to chest tightness today.  Pertinent H&P findings include N/V. Central CP. No diaphoresis. Pain is non exertional. Notes cough/congestion. No fevers. No hx ACS. Noted FH.  Also noted BLE leg cramping. Hx same with low magnesium. Based on initial evaluation, labs are indicated and radiology studies are indicated.  Patient counseled on process, plan, and necessity for staying for completing the evaluation.       Audry PiliMohr, Bazil Dhanani, PA-C 08/01/17 1436    Margarita Grizzleay, Danielle, MD 08/01/17 [phone]

## 2017-08-01 NOTE — ED Notes (Signed)
Patient ambulated to nurse desk without difficulty and steady gait- notes she has a headache and frustrated about wait. RN apologetic and discussed plan of care with patient.

## 2017-08-01 NOTE — ED Triage Notes (Signed)
Pt sent to ED by doctor - has been having multiple episodes of chest tightness and SOB lasting 10 minutes at a time, along with nausea, lightheadedness worsened by exertion. Also reports chronic leg cramps that are worse, states she has had a problem with her magnesium level in the past. Resp e/u. Skin warm/dry.

## 2017-08-01 NOTE — ED Provider Notes (Signed)
[facility] EMERGENCY DEPARTMENT Provider Note   CSN: [id number] Arrival date & time: 08/01/17  1355     History   Chief Complaint Chief Complaint  Patient presents with  . Dizziness  . Shortness of Breath    HPI Gloria Martin is a 57 y.o. female.  Patient presents to the ED with a chief complaint of chest pain and SOB.  She reports associated dizziness/lightheadedness.  She states that she has had the symptoms intermittently for a while, but that her symptoms were worse yesterday and this morning while she was being active.  The symptoms seem to only be exertional.  She was seen at [facility] and was sent to the ED for evaluation.  She states that she has no pain or SOB or dizziness now.  Currently, she is mainly concerned about her chronic bilateral leg cramps.  She takes carisoprodol for the cramps.  Cardiac risk factors include age, family history and hypertension.  She denies ever having had PE or DVT.  Denies any recent surgery or prolonged immobilization.   The history is provided by the patient. No language interpreter was used.    Past Medical History:  Diagnosis Date  . Hypertension   . Leg cramps   . Obesity     There are no active problems to display for this patient.   Past Surgical History:  Procedure Laterality Date  . TUBAL LIGATION      OB History    No data available       Home Medications    Prior to Admission medications   Medication Sig Start Date End Date Taking? Authorizing Provider  albuterol (PROVENTIL HFA;VENTOLIN HFA) 108 (90 Base) MCG/ACT inhaler Inhale 2 puffs into the lungs every 6 (six) hours as needed for wheezing or shortness of breath.    [provider]  aspirin-acetaminophen-caffeine (EXCEDRIN MIGRAINE) [phone] MG tablet Take 1 tablet by mouth every 6 (six) hours as needed for headache.    [provider]  azithromycin (ZITHROMAX) 250 MG tablet Take 1 tablet (250 mg total) by mouth daily. Take first  2 tablets together, then 1 every day until finished. Patient not taking: Reported on 08/04/2016 11/22/15   Melton Krebs, PA-C  benzonatate (TESSALON) 100 MG capsule Take 1 capsule (100 mg total) by mouth 3 (three) times daily as needed for cough. Patient not taking: Reported on 08/04/2016 11/19/15   Pricilla Loveless, MD  losartan (COZAAR) 100 MG tablet Take 100 mg by mouth daily. 05/29/16   [provider]  predniSONE (DELTASONE) 20 MG tablet Take 1 tablet (20 mg total) by mouth daily. Patient not taking: Reported on 08/04/2016 11/22/15   Melton Krebs, PA-C    Family History Family History  Problem Relation Age of Onset  . Hypertension Mother   . Hypertension Brother     Social History Social History   Tobacco Use  . Smoking status: Current Every Day Smoker    Packs/day: 1.00    Types: Cigarettes  . Smokeless tobacco: Never Used  Substance Use Topics  . Alcohol use: No  . Drug use: No     Allergies   Patient has no known allergies.   Review of Systems Review of Systems  All other systems reviewed and are negative.    Physical Exam Updated Vital Signs BP (!) 120/55 (BP Location: Left Arm)   Pulse 93   Temp 98.3 F (36.8 C) (Oral)   Resp 16   Ht 5\' 8"  (1.727  m)   Wt 108.1 kg (238 lb 4 oz)   SpO2 99%   BMI 36.23 kg/m   Physical Exam  Constitutional: She is oriented to person, place, and time. She appears well-developed and well-nourished.  HENT:  Head: Normocephalic and atraumatic.  Eyes: Conjunctivae and EOM are normal. Pupils are equal, round, and reactive to light.  Neck: Normal range of motion. Neck supple.  Cardiovascular: Normal rate and regular rhythm. Exam reveals no gallop and no friction rub.  No murmur heard. Pulmonary/Chest: Effort normal and breath sounds normal. No respiratory distress. She has no wheezes. She has no rales. She exhibits no tenderness.  Abdominal: Soft. Bowel sounds are normal. She exhibits no distension and  no mass. There is no tenderness. There is no rebound and no guarding.  Musculoskeletal: Normal range of motion. She exhibits no edema or tenderness.       Right lower leg: She exhibits no tenderness and no edema.       Left lower leg: She exhibits no tenderness and no edema.  Neurological: She is alert and oriented to person, place, and time.  Skin: Skin is warm and dry. No rash noted.  Mild blotching on the upper anterior thighs, doesn't appear to be drug rash or infection  Psychiatric: She has a normal mood and affect. Her behavior is normal. Judgment and thought content normal.  Nursing note and vitals reviewed.    ED Treatments / Results  Labs (all labs ordered are listed, but only abnormal results are displayed) Labs Reviewed  CBC - Abnormal; Notable for the following components:      Result Value   WBC 12.3 (*)    All other components within normal limits  BASIC METABOLIC PANEL - Abnormal; Notable for the following components:   Glucose, Bld 158 (*)    All other components within normal limits  MAGNESIUM  I-STAT TROPONIN, ED  I-STAT TROPONIN, ED    EKG  EKG Interpretation  Date/Time:  Wednesday August 01 2017 14:44:40 EST Ventricular Rate:  97 PR Interval:  136 QRS Duration: 72 QT Interval:  346 QTC Calculation: 439 R Axis:   6 Text Interpretation:  Normal sinus rhythm Low voltage QRS Cannot rule out Anterior infarct , age undetermined Abnormal ECG When compared with ECG of 08/04/2016, No significant change was found Confirmed by Dione BoozeGlick, David ([id number]) on 08/01/2017 10:51:57 PM       Radiology Dg Chest 2 View  Result Date: 08/01/2017 CLINICAL DATA:  Intermittent right chest pain. Smoking history and hypertension. EXAM: CHEST  2 VIEW COMPARISON:  08/04/2016 FINDINGS: Heart size is normal. Mediastinal shadows are normal. Lungs are clear. Ordinary mild degenerative changes affect the spine. No effusions. IMPRESSION: No active cardiopulmonary disease. Electronically Signed    By: Paulina FusiMark  Shogry M.D.   On: 08/01/2017 15:19    Procedures Procedures (including critical care time)  Medications Ordered in ED Medications - No data to display   Initial Impression / Assessment and Plan / ED Course  I have reviewed the triage vital signs and the nursing notes.  Pertinent labs & imaging results that were available during my care of the patient were reviewed by me and considered in my medical decision making (see chart for details).     Patient presents with chest pain and SOB and dizziness.  She has had the symptoms intermittently.  They occur with exertion.  [facility] to [facility] ED for evaluation.  DDx includes ACS, PE, pneumothorax, aortic  dissection, esophageal rupture, pericarditis, chest wall pain.  Doubt ACS, normal troponin, no ischemic EKG findings, HEART score is: 3.  Low risk for PE, Well's PE score is 1.5, patient is not tachycardic nor hypoxic.  No evidence of pneumothorax on CXR.  Doubt dissection, no mediastinal widening on CXR, no ripping/tearing chest pain, neurovascularly intact.  Doubt pericarditis, no positional changes, or diffuse ST elevations on EKG.  Pain is not reproducible, doubt MSK.  Delta troponin is negative.  Patient is currently pain free.  Recommend close follow-up with cardiology and PCP.  Will also order outpatient DVT studies.  All findings were discussed with patient.  Patient understands and agrees with the plan.     Final Clinical Impressions(s) / ED Diagnoses   Final diagnoses:  Shortness of breath  Chest pain, unspecified type  Dizziness    ED Discharge Orders        Ordered    LE VENOUS     08/01/17 2348       Roxy Horseman, PA-C 08/02/17 0013    Dione Booze, MD 08/02/17 [phone]

## 2017-08-02 ENCOUNTER — Ambulatory Visit (HOSPITAL_COMMUNITY)
Admission: RE | Admit: 2017-08-02 | Discharge: 2017-08-02 | Disposition: A | Discharge status: Home / Self Care | Payer: Self-pay | Source: Ambulatory Visit | Attending: Emergency Medicine | Admitting: Emergency Medicine

## 2017-08-02 DIAGNOSIS — M79605 Pain in left leg: Secondary | ICD-10-CM | POA: Insufficient documentation

## 2017-08-02 DIAGNOSIS — M79609 Pain in unspecified limb: Secondary | ICD-10-CM

## 2017-08-02 DIAGNOSIS — M79604 Pain in right leg: Secondary | ICD-10-CM | POA: Insufficient documentation

## 2017-08-02 LAB — I-STAT TROPONIN, ED: Troponin i, poc: 0 ng/mL (ref 0.00–0.08)

## 2017-08-02 NOTE — ED Notes (Signed)
Pt verbalizes understanding of d/c instructions.

## 2017-08-02 NOTE — Progress Notes (Signed)
Bilateral lower extremity venous duplex has been completed. Negative for DVT.  08/02/17 8:25 AM Gloria Martin RVT

## 2017-08-09 ENCOUNTER — Encounter: Payer: Self-pay | Admitting: Cardiology

## 2017-08-09 ENCOUNTER — Ambulatory Visit (INDEPENDENT_AMBULATORY_CARE_PROVIDER_SITE_OTHER): Payer: Self-pay | Admitting: Cardiology

## 2017-08-09 DIAGNOSIS — I10 Essential (primary) hypertension: Secondary | ICD-10-CM | POA: Insufficient documentation

## 2017-08-09 DIAGNOSIS — R079 Chest pain, unspecified: Secondary | ICD-10-CM

## 2017-08-09 DIAGNOSIS — Z716 Tobacco abuse counseling: Secondary | ICD-10-CM

## 2017-08-09 DIAGNOSIS — E663 Overweight: Secondary | ICD-10-CM

## 2017-08-09 HISTORY — DX: Overweight: E66.3

## 2017-08-09 HISTORY — DX: Essential (primary) hypertension: I10

## 2017-08-09 HISTORY — DX: Chest pain, unspecified: R07.9

## 2017-08-09 NOTE — Progress Notes (Signed)
Cardiology Office Note:    Date:  08/09/2017   ID:  Gloria Martin, DOB 1960/09/22, MRN [id number]  PCP:  System, Pcp Not In  Cardiologist:  Garwin Brothers, MD   Referring MD: No ref. provider found    ASSESSMENT:    1. Chest pain, unspecified type   2. Overweight   3. Essential hypertension    PLAN:    In order of problems listed above:  1. I discussed my findings with the patient at extensive length.  Primary prevention stressed with the patient.  Course of compliance with diet and medications stressed and she vocalized understanding.  The risks of obesity explained the plans to diet and reduce weight. 2. We will check her lipids in the next few days to assess her for risk stratification. 3. Echocardiogram will be done to assess murmur heard on auscultation. 4. She has chest discomfort not typical for angina risk factors we will perform a exercise stress Cardiolite.  If she is unable to do this this may be switched to a Lexiscan sestamibi. 5. I spent 5 minutes with the patient discussing solely about smoking. Smoking cessation was counseled. I suggested to the patient also different medications and pharmacological interventions. Patient is keen to try stopping on its own at this time. He will get back to me if he needs any further assistance in this matter.   Medication Adjustments/Labs and Tests Ordered: Current medicines are reviewed at length with the patient today.  Concerns regarding medicines are outlined above.  No orders of the defined types were placed in this encounter.  No orders of the defined types were placed in this encounter.    History of Present Illness:    Gloria Martin is a 57 y.o. female who is being seen today for the evaluation of essential hypertension and chest discomfort by the emergency room.  Patient is a pleasant 57 year old female.  She has past medical history of essential hypertension.  She has been referred here because she mentions to me that  her blood pressure is difficult to control and she needs to be optimized.  She is overweight.  She leads a sedentary lifestyle.  She tells me that she works all day long at present.  She also gives a history of scoliosis for which reason she states she does not exercise on a regular basis.  She has chest discomfort at times not related to exertion.  This has no radiation.  No orthopnea or PND.  At the time of my evaluation, the patient is alert awake oriented and in no distress.  Past Medical History:  Diagnosis Date  . Hypertension   . Leg cramps   . Obesity     Past Surgical History:  Procedure Laterality Date  . TUBAL LIGATION      Current Medications: Current Meds  Medication Sig  . aspirin-acetaminophen-caffeine (EXCEDRIN MIGRAINE) 250-250-65 MG tablet Take 1 tablet by mouth every 6 (six) hours as needed for headache.  . carisoprodol (SOMA) 350 MG tablet Take 350 mg by mouth 3 (three) times daily as needed.  Marland Kitchen losartan-hydrochlorothiazide (HYZAAR) 100-25 MG tablet Take 1 tablet by mouth daily.  . [DISCONTINUED] losartan (COZAAR) 100 MG tablet Take 100 mg by mouth daily.     Allergies:   Patient has no known allergies.   Social History   Socioeconomic History  . Marital status: Married    Spouse name: None  . Number of children: None  . Years of education: None  . Highest  education level: None  Social Needs  . Financial resource strain: None  . Food insecurity - worry: None  . Food insecurity - inability: None  . Transportation needs - medical: None  . Transportation needs - non-medical: None  Occupational History  . None  Tobacco Use  . Smoking status: Current Every Day Smoker    Packs/day: 1.00    Types: Cigarettes  . Smokeless tobacco: Never Used  Substance and Sexual Activity  . Alcohol use: No  . Drug use: No  . Sexual activity: None  Other Topics Concern  . None  Social History Narrative  . None     Family History: The patient's family history  includes Hypertension in her brother and mother.  ROS:   Please see the history of present illness.    All other systems reviewed and are negative.  EKGs/Labs/Other Studies Reviewed:    The following studies were reviewed today: Reviewed emergency room records and discussed with the patient at length.  EKG reveals sinus rhythm nonspecific ST-T changes and poor anterior forces.   Recent Labs: 08/01/2017: BUN 17; Creatinine, Ser 0.90; Hemoglobin 14.1; Magnesium 2.1; Platelets 339; Potassium 3.7; Sodium 137  Recent Lipid Panel    Component Value Date/Time   LDLDIRECT 123 (H) 10/30/2013 1806    Physical Exam:    VS:  BP (!) 160/104 (BP Location: Right Arm, Patient Position: Sitting, Cuff Size: Normal)   Pulse 62   Ht 5\' 8"  (1.727 m)   Wt 238 lb (108 kg)   SpO2 99%   BMI 36.19 kg/m     Wt Readings from Last 3 Encounters:  08/09/17 238 lb (108 kg)  08/01/17 238 lb 4 oz (108.1 kg)  11/22/15 216 lb (98 kg)     GEN: Patient is in no acute distress HEENT: Normal NECK: No JVD; No carotid bruits LYMPHATICS: No lymphadenopathy CARDIAC: S1 S2 regular, 2/6 systolic murmur at the apex. RESPIRATORY:  Clear to auscultation without rales, wheezing or rhonchi  ABDOMEN: Soft, non-tender, non-distended MUSCULOSKELETAL:  No edema; No deformity  SKIN: Warm and dry NEUROLOGIC:  Alert and oriented x 3 PSYCHIATRIC:  Normal affect    Signed, Garwin Brothersajan R Idabell Picking, MD  08/09/2017 10:20 AM    [facility]

## 2017-08-09 NOTE — Patient Instructions (Signed)
Medication Instructions:  Your physician recommends that you continue on your current medications as directed. Please refer to the Current Medication list given to you today.  Labwork: Your physician recommends that you have the following labs drawn: lipid panel  Testing/Procedures: Your physician has requested that you have an echocardiogram. Echocardiography is a painless test that uses sound waves to create images of your heart. It provides your doctor with information about the size and shape of your heart and how well your heart's chambers and valves are working. This procedure takes approximately one hour. There are no restrictions for this procedure.  Your physician has requested that you have a lexiscan myoview. For further information please visit [email or web address]. Please follow instruction sheet, as given.  Follow-Up: Your physician recommends that you schedule a follow-up appointment in: 1 month  Any Other Special Instructions Will Be Listed Below (If Applicable).     If you need a refill on your cardiac medications before your next appointment, please call your pharmacy.   [facility]  Garey HamAshley A, RN, BSN

## 2017-08-10 LAB — LIPID PANEL
CHOL/HDL RATIO: 3.6 ratio (ref 0.0–4.4)
CHOLESTEROL TOTAL: 221 mg/dL — AB (ref 100–199)
HDL: 62 mg/dL (ref >39)
LDL Calculated: 132 mg/dL — ABNORMAL HIGH (ref 0–99)
TRIGLYCERIDES: 136 mg/dL (ref 0–149)
VLDL Cholesterol Cal: 27 mg/dL (ref 5–40)

## 2017-10-04 ENCOUNTER — Other Ambulatory Visit: Payer: Self-pay

## 2017-10-04 ENCOUNTER — Ambulatory Visit (HOSPITAL_COMMUNITY): Payer: Self-pay | Attending: Cardiovascular Disease

## 2017-10-04 DIAGNOSIS — I509 Heart failure, unspecified: Secondary | ICD-10-CM | POA: Insufficient documentation

## 2017-10-04 DIAGNOSIS — I10 Essential (primary) hypertension: Secondary | ICD-10-CM

## 2017-10-04 DIAGNOSIS — R079 Chest pain, unspecified: Secondary | ICD-10-CM

## 2017-10-08 ENCOUNTER — Telehealth (HOSPITAL_COMMUNITY): Payer: Self-pay | Admitting: *Deleted

## 2017-10-08 NOTE — Telephone Encounter (Signed)
Left message on voicemail per DPR in reference to upcoming appointment scheduled on 10/10/17 with detailed instructions given per Myocardial Perfusion Study Information Sheet for the test. LM to arrive 15 minutes early, and that it is imperative to arrive on time for appointment to keep from having the test rescheduled. If you need to cancel or reschedule your appointment, please call the office within 24 hours of your appointment. Failure to do so may result in a cancellation of your appointment, and a $50 no show fee. Phone number given for call back for any questions. Ricky AlaSmith, Bryona Jacqueline

## 2017-10-10 ENCOUNTER — Ambulatory Visit (HOSPITAL_COMMUNITY): Payer: Self-pay | Attending: Cardiology

## 2017-10-10 DIAGNOSIS — R079 Chest pain, unspecified: Secondary | ICD-10-CM

## 2017-10-10 DIAGNOSIS — I10 Essential (primary) hypertension: Secondary | ICD-10-CM

## 2017-10-10 MED ORDER — TECHNETIUM TC 99M TETROFOSMIN IV KIT
33.0000 | PACK | Freq: Once | INTRAVENOUS | Status: AC | PRN
  Administered 2017-10-10: 33 via INTRAVENOUS
  Filled 2017-10-10: qty 33

## 2017-10-11 ENCOUNTER — Ambulatory Visit (HOSPITAL_COMMUNITY): Payer: Self-pay

## 2017-10-11 LAB — MYOCARDIAL PERFUSION IMAGING
CHL RATE OF PERCEIVED EXERTION: 18
CSEPED: 6 min
CSEPEW: 7 METS
CSEPHR: 90 %
LV dias vol: 77 mL (ref 46–106)
LV sys vol: 26 mL
MPHR: 165 {beats}/min
Peak HR: 150 {beats}/min
RATE: 0.33
Rest HR: 86 {beats}/min
SDS: 2
SRS: 0
SSS: 2
TID: 0.85

## 2017-10-11 MED ORDER — TECHNETIUM TC 99M TETROFOSMIN IV KIT
31.1000 | PACK | Freq: Once | INTRAVENOUS | Status: AC | PRN
  Administered 2017-10-11: 31.1 via INTRAVENOUS
  Filled 2017-10-11: qty 32

## 2017-12-24 DIAGNOSIS — M545 Low back pain, unspecified: Secondary | ICD-10-CM

## 2017-12-24 DIAGNOSIS — G8929 Other chronic pain: Secondary | ICD-10-CM | POA: Insufficient documentation

## 2017-12-24 HISTORY — DX: Low back pain, unspecified: M54.50

## 2018-01-26 DIAGNOSIS — R252 Cramp and spasm: Secondary | ICD-10-CM

## 2018-01-26 HISTORY — DX: Cramp and spasm: R25.2

## 2018-10-01 ENCOUNTER — Other Ambulatory Visit: Payer: Self-pay | Admitting: Urgent Care

## 2020-02-03 ENCOUNTER — Emergency Department (HOSPITAL_COMMUNITY)
Admission: EM | Admit: 2020-02-03 | Discharge: 2020-02-03 | Disposition: A | Discharge status: Home / Self Care | Payer: Self-pay | Attending: Emergency Medicine | Admitting: Emergency Medicine

## 2020-02-03 ENCOUNTER — Encounter (HOSPITAL_COMMUNITY): Payer: Self-pay

## 2020-02-03 ENCOUNTER — Other Ambulatory Visit: Payer: Self-pay

## 2020-02-03 ENCOUNTER — Emergency Department (HOSPITAL_COMMUNITY): Payer: Self-pay

## 2020-02-03 DIAGNOSIS — I10 Essential (primary) hypertension: Secondary | ICD-10-CM | POA: Insufficient documentation

## 2020-02-03 DIAGNOSIS — Z20822 Contact with and (suspected) exposure to covid-19: Secondary | ICD-10-CM | POA: Insufficient documentation

## 2020-02-03 DIAGNOSIS — Z7982 Long term (current) use of aspirin: Secondary | ICD-10-CM | POA: Insufficient documentation

## 2020-02-03 DIAGNOSIS — Z79899 Other long term (current) drug therapy: Secondary | ICD-10-CM | POA: Insufficient documentation

## 2020-02-03 DIAGNOSIS — J189 Pneumonia, unspecified organism: Secondary | ICD-10-CM | POA: Insufficient documentation

## 2020-02-03 DIAGNOSIS — F17210 Nicotine dependence, cigarettes, uncomplicated: Secondary | ICD-10-CM | POA: Insufficient documentation

## 2020-02-03 DIAGNOSIS — R197 Diarrhea, unspecified: Secondary | ICD-10-CM | POA: Insufficient documentation

## 2020-02-03 LAB — CBC WITH DIFFERENTIAL/PLATELET
Abs Immature Granulocytes: 0.03 10*3/uL (ref 0.00–0.07)
Basophils Absolute: 0.1 10*3/uL (ref 0.0–0.1)
Basophils Relative: 1 %
Eosinophils Absolute: 0.1 10*3/uL (ref 0.0–0.5)
Eosinophils Relative: 1 %
HCT: 47.2 % — ABNORMAL HIGH (ref 36.0–46.0)
Hemoglobin: 15.9 g/dL — ABNORMAL HIGH (ref 12.0–15.0)
Immature Granulocytes: 0 %
Lymphocytes Relative: 34 %
Lymphs Abs: 3.8 10*3/uL (ref 0.7–4.0)
MCH: 29.4 pg (ref 26.0–34.0)
MCHC: 33.7 g/dL (ref 30.0–36.0)
MCV: 87.4 fL (ref 80.0–100.0)
Monocytes Absolute: 1.1 10*3/uL — ABNORMAL HIGH (ref 0.1–1.0)
Monocytes Relative: 10 %
Neutro Abs: 6.2 10*3/uL (ref 1.7–7.7)
Neutrophils Relative %: 54 %
Platelets: 292 10*3/uL (ref 150–400)
RBC: 5.4 MIL/uL — ABNORMAL HIGH (ref 3.87–5.11)
RDW: 12.9 % (ref 11.5–15.5)
WBC: 11.2 10*3/uL — ABNORMAL HIGH (ref 4.0–10.5)
nRBC: 0 % (ref 0.0–0.2)

## 2020-02-03 LAB — SARS CORONAVIRUS 2 BY RT PCR (HOSPITAL ORDER, PERFORMED IN ~~LOC~~ HOSPITAL LAB): SARS Coronavirus 2: NEGATIVE

## 2020-02-03 LAB — BASIC METABOLIC PANEL
Anion gap: 13 (ref 5–15)
BUN: 24 mg/dL — ABNORMAL HIGH (ref 6–20)
CO2: 22 mmol/L (ref 22–32)
Calcium: 9.2 mg/dL (ref 8.9–10.3)
Chloride: 97 mmol/L — ABNORMAL LOW (ref 98–111)
Creatinine, Ser: 1.22 mg/dL — ABNORMAL HIGH (ref 0.44–1.00)
GFR calc Af Amer: 57 mL/min — ABNORMAL LOW (ref >60)
GFR calc non Af Amer: 49 mL/min — ABNORMAL LOW (ref >60)
Glucose, Bld: 137 mg/dL — ABNORMAL HIGH (ref 70–99)
Potassium: 3.4 mmol/L — ABNORMAL LOW (ref 3.5–5.1)
Sodium: 132 mmol/L — ABNORMAL LOW (ref 135–145)

## 2020-02-03 MED ORDER — DOXYCYCLINE HYCLATE 100 MG PO TABS
100.0000 mg | ORAL_TABLET | Freq: Once | ORAL | Status: AC
Start: 2020-02-03 — End: 2020-02-03
  Administered 2020-02-03: 22:00:00 100 mg via ORAL
  Filled 2020-02-03: qty 1

## 2020-02-03 MED ORDER — ALBUTEROL SULFATE HFA 108 (90 BASE) MCG/ACT IN AERS
2.0000 | INHALATION_SPRAY | Freq: Once | RESPIRATORY_TRACT | Status: AC
  Administered 2020-02-03: 20:00:00 2 via RESPIRATORY_TRACT
  Filled 2020-02-03: qty 6.7

## 2020-02-03 MED ORDER — DOXYCYCLINE HYCLATE 100 MG PO CAPS: 100.0000 mg | ORAL_CAPSULE | Freq: Two times a day (BID) | ORAL | 0 refills | Status: AC

## 2020-02-03 MED ORDER — SODIUM CHLORIDE 0.9 % IV BOLUS
1000.0000 mL | Freq: Once | INTRAVENOUS | Status: AC
  Administered 2020-02-03: 22:00:00 1000 mL via INTRAVENOUS

## 2020-02-03 NOTE — ED Notes (Signed)
Patient o2 saturation remained between 93-94% while ambulating with this RN. Patient complaining of dizziness and SOB while ambulating.

## 2020-02-03 NOTE — Discharge Instructions (Signed)
Your chest xray showed findings concerning for pneumonia. Please pick up antibiotics and take as prescribed.  Follow up with your PCP for recheck of chest xray in 2 weeks time. If you do not have a PCP you can follow up with [facility] for primary care needs.  You can use the albuterol inhaler as needed.  Continue monitoring your symptoms at home. Return to the ED IMMEDIATELY for any worsening symptoms including worsening shortness of breath, chest pain, persistent fevers > 100.4, coughing up blood, pulse ox < 88%, or any other new/concerning symptoms.

## 2020-02-03 NOTE — ED Provider Notes (Signed)
[facility]-EMERGENCY DEPT Provider Note   CSN: [id number] Arrival date & time: 02/03/20  1828     History Chief Complaint  Patient presents with  . Cough  . low O2 Sats    Gloria Martin is a 59 y.o. female with PMHx HTN who presents to the ED today with complaint of dry cough x 4 days. Pt also complains of shortness of breath associated with the cough. She has been taking OTC medications without relief. Does report recent sick contact with individual who also had a cough. Daughter is a Engineer, civil (consulting) and brought home a rapid antigen COVID test today which was negative. Daughter convinced pt to come to the ED for further evaluation. Pt is not vaccinated against COVID 19. She has also been having diarrhea. She denies fevers, chills, chest pain, abdominal pain, nausea, vomiting, or any other associated symptoms.   The history is provided by the patient and medical records.       Past Medical History:  Diagnosis Date  . Hypertension   . Leg cramps   . Obesity     Patient Active Problem List   Diagnosis Date Noted  . Chest pain 08/09/2017  . Overweight 08/09/2017  . Essential hypertension 08/09/2017    Past Surgical History:  Procedure Laterality Date  . TUBAL LIGATION       OB History   No obstetric history on file.     Family History  Problem Relation Age of Onset  . Hypertension Mother   . Hypertension Brother     Social History   Tobacco Use  . Smoking status: Current Every Day Smoker    Packs/day: 1.00    Types: Cigarettes  . Smokeless tobacco: Never Used  Vaping Use  . Vaping Use: Never used  Substance Use Topics  . Alcohol use: No  . Drug use: No    Home Medications Prior to Admission medications   Medication Sig Start Date End Date Taking? Authorizing Provider  aspirin-acetaminophen-caffeine (EXCEDRIN MIGRAINE) [phone] MG tablet Take 1 tablet by mouth every 6 (six) hours as needed for headache.    [provider]   carisoprodol (SOMA) 350 MG tablet Take 350 mg by mouth 3 (three) times daily as needed. 06/18/17   [provider]  doxycycline (VIBRAMYCIN) 100 MG capsule Take 1 capsule (100 mg total) by mouth 2 (two) times daily for 7 days. 02/03/20 02/10/20  Tanda Rockers, PA-C  losartan-hydrochlorothiazide (HYZAAR) 100-25 MG tablet Take 1 tablet by mouth daily. 07/14/17   [provider]    Allergies    Patient has no known allergies.  Review of Systems   Review of Systems  Constitutional: Negative for chills and fever.  Respiratory: Positive for cough and shortness of breath.   Cardiovascular: Negative for chest pain.  Gastrointestinal: Positive for diarrhea. Negative for abdominal pain, nausea and vomiting.  All other systems reviewed and are negative.   Physical Exam Updated Vital Signs BP 128/79 (BP Location: Left Arm)   Pulse (!) 105   Temp 97.6 F (36.4 C) (Oral)   Resp 18   Ht 5\' 5"  (1.651 m)   Wt 113.4 kg   SpO2 95%   BMI 41.60 kg/m   Physical Exam Vitals and nursing note reviewed.  Constitutional:      Appearance: She is obese. She is not ill-appearing or diaphoretic.  HENT:     Head: Normocephalic and atraumatic.  Eyes:     Conjunctiva/sclera: Conjunctivae normal.  Cardiovascular:  Rate and Rhythm: Normal rate and regular rhythm.  Pulmonary:     Effort: Pulmonary effort is normal.     Breath sounds: Wheezing present. No rhonchi or rales.     Comments: Diminished breath sounds bilaterally with very faint end expiratory wheezes. Able to speak in full sentences without difficulty. Satting 95% on RA. 93-94% with ambulation.  Abdominal:     Palpations: Abdomen is soft.     Tenderness: There is no abdominal tenderness.  Musculoskeletal:     Cervical back: Neck supple.  Skin:    General: Skin is warm and dry.  Neurological:     Mental Status: She is alert.     ED Results / Procedures / Treatments   Labs (all labs ordered are listed, but only  abnormal results are displayed) Labs Reviewed  BASIC METABOLIC PANEL - Abnormal; Notable for the following components:      Result Value   Sodium 132 (*)    Potassium 3.4 (*)    Chloride 97 (*)    Glucose, Bld 137 (*)    BUN 24 (*)    Creatinine, Ser 1.22 (*)    GFR calc non Af Amer 49 (*)    GFR calc Af Amer 57 (*)    All other components within normal limits  CBC WITH DIFFERENTIAL/PLATELET - Abnormal; Notable for the following components:   WBC 11.2 (*)    RBC 5.40 (*)    Hemoglobin 15.9 (*)    HCT 47.2 (*)    Monocytes Absolute 1.1 (*)    All other components within normal limits  SARS CORONAVIRUS 2 BY RT PCR (HOSPITAL ORDER, PERFORMED IN [facility])    EKG None  Radiology DG Chest Port 1 View  Result Date: 02/03/2020 CLINICAL DATA:  Cough. EXAM: PORTABLE CHEST 1 VIEW COMPARISON:  Prior chest radiographs 08/01/2017 and earlier. FINDINGS: Heart size within normal limits. Aortic atherosclerosis. Mild ill-defined opacity within the left lung base. No evidence of pleural effusion or pneumothorax. No acute bony abnormality. IMPRESSION: Subtle ill-defined opacity within the left lung base may reflect atelectasis or early pneumonia. Short-interval radiographic follow-up is recommended. Aortic Atherosclerosis (ICD10-I70.0). Electronically Signed   By: Jackey Loge DO   On: 02/03/2020 20:19    Procedures Procedures (including critical care time)  Medications Ordered in ED Medications  albuterol (VENTOLIN HFA) 108 (90 Base) MCG/ACT inhaler 2 puff (2 puffs Inhalation Given 02/03/20 2020)  sodium chloride 0.9 % bolus 1,000 mL (1,000 mLs Intravenous New Bag/Given 02/03/20 2146)  doxycycline (VIBRA-TABS) tablet 100 mg (100 mg Oral Given 02/03/20 2209)    ED Course  I have reviewed the triage vital signs and the nursing notes.  Pertinent labs & imaging results that were available during my care of the patient were reviewed by me and considered in my medical decision making  (see chart for details).  Clinical Course as of Feb 02 2209  Tue Feb 03, 2020  2117 SARS Coronavirus 2: NEGATIVE [MV]    Clinical Course User Index [MV] Tanda Rockers, PA-C   MDM Rules/Calculators/A&P                          59 year old female who presents to the ED today with a dry cough and shortness of breath x4 days with positive sick contacts.  Has not been vaccinated against COVID-19.  Daughter is a Engineer, civil (consulting) who brought home a rapid antigen Covid test today which was negative.  On  arrival to the ED patient is afebrile and nontachypneic.  She is noted to be mildly tachycardic in the low 100s.  She is satting 95% on room air.  Some diminished breath sounds bilaterally as well as end expiratory wheezes are very faint.  Will obtain chest x-ray at this time.  Will swab for Covid with PCR test.  We will plan to ambulate with pulse ox on to determine any desats.   Patient ambulated in the room.  O2 remained at 93 to 94%.  X-ray with subtle ill-defined opacity, atelectasis versus early pneumonia.  Will obtain lab work at this time.   CBC with mild elevation in leukocytosis at 11.2.  Hemoglobin and hematocrit are slightly elevated consistent with hemoconcentration. BMP with a creatinine of 1.22 and a BUN of 24.  Will provide IV fluids at this time. COVID test negative.  Will discharge patient home at this time treat for community-acquired pneumonia.  Will provide Doxy.  Patient instructed to follow-up with her PCP regarding her symptoms and for recheck.  Advised to continue monitoring symptoms at home.  Precautions have been discussed.  Patient is in agreement with plan is stable for discharge.   This note was prepared using Dragon voice recognition software and may include unintentional dictation errors due to the inherent limitations of voice recognition software.  Gloria Martin was evaluated in Emergency Department on 02/03/2020 for the symptoms described in the history of present illness. She  was evaluated in the context of the global COVID-19 pandemic, which necessitated consideration that the patient might be at risk for infection with the SARS-CoV-2 virus that causes COVID-19. Institutional protocols and algorithms that pertain to the evaluation of patients at risk for COVID-19 are in a state of rapid change based on information released by regulatory bodies including the CDC and federal and state organizations. These policies and algorithms were followed during the patient's care in the ED.  Final Clinical Impression(s) / ED Diagnoses Final diagnoses:  Community acquired pneumonia of left lung, unspecified part of lung    Rx / DC Orders ED Discharge Orders         Ordered    doxycycline (VIBRAMYCIN) 100 MG capsule  2 times daily     Discontinue  Reprint     02/03/20 2207           Discharge Instructions     Your chest xray showed findings concerning for pneumonia. Please pick up antibiotics and take as prescribed.  Follow up with your PCP for recheck of chest xray in 2 weeks time. If you do not have a PCP you can follow up with [facility] for primary care needs.  You can use the albuterol inhaler as needed.  Continue monitoring your symptoms at home. Return to the ED IMMEDIATELY for any worsening symptoms including worsening shortness of breath, chest pain, persistent fevers > 100.4, coughing up blood, pulse ox < 88%, or any other new/concerning symptoms.        Tanda Rockers, PA-C 02/03/20 2210    Lorre Nick, MD 02/04/20 [phone]

## 2020-02-03 NOTE — ED Triage Notes (Signed)
Patient reports a dry hacking cough and expiratory wheezing  X 4 days. Patient states her O2 Sats at home were 79%.  O2 Sats were 95% in triage. Patient also c/o dizziness. Patient states her daughter is a Engineer, civil (consulting) and gave her a rapid Covid test at home and it was negative.

## 2020-04-06 DIAGNOSIS — E782 Mixed hyperlipidemia: Secondary | ICD-10-CM

## 2020-04-06 HISTORY — DX: Mixed hyperlipidemia: E78.2

## 2020-04-07 ENCOUNTER — Emergency Department (HOSPITAL_COMMUNITY)
Admission: EM | Admit: 2020-04-07 | Discharge: 2020-04-07 | Disposition: A | Discharge status: Home / Self Care | Payer: Self-pay | Attending: Emergency Medicine | Admitting: Emergency Medicine

## 2020-04-07 ENCOUNTER — Emergency Department (HOSPITAL_COMMUNITY): Payer: Self-pay

## 2020-04-07 ENCOUNTER — Other Ambulatory Visit: Payer: Self-pay

## 2020-04-07 DIAGNOSIS — Z79899 Other long term (current) drug therapy: Secondary | ICD-10-CM | POA: Insufficient documentation

## 2020-04-07 DIAGNOSIS — R110 Nausea: Secondary | ICD-10-CM

## 2020-04-07 DIAGNOSIS — U071 COVID-19: Secondary | ICD-10-CM | POA: Insufficient documentation

## 2020-04-07 DIAGNOSIS — I10 Essential (primary) hypertension: Secondary | ICD-10-CM | POA: Insufficient documentation

## 2020-04-07 DIAGNOSIS — F17210 Nicotine dependence, cigarettes, uncomplicated: Secondary | ICD-10-CM | POA: Insufficient documentation

## 2020-04-07 DIAGNOSIS — G43009 Migraine without aura, not intractable, without status migrainosus: Secondary | ICD-10-CM | POA: Insufficient documentation

## 2020-04-07 LAB — CBC
HCT: 45.7 % (ref 36.0–46.0)
Hemoglobin: 15.5 g/dL — ABNORMAL HIGH (ref 12.0–15.0)
MCH: 30.4 pg (ref 26.0–34.0)
MCHC: 33.9 g/dL (ref 30.0–36.0)
MCV: 89.6 fL (ref 80.0–100.0)
Platelets: 212 10*3/uL (ref 150–400)
RBC: 5.1 MIL/uL (ref 3.87–5.11)
RDW: 13.3 % (ref 11.5–15.5)
WBC: 5.4 10*3/uL (ref 4.0–10.5)
nRBC: 0 % (ref 0.0–0.2)

## 2020-04-07 LAB — COMPREHENSIVE METABOLIC PANEL
ALT: 55 U/L — ABNORMAL HIGH (ref 0–44)
AST: 68 U/L — ABNORMAL HIGH (ref 15–41)
Albumin: 3.6 g/dL (ref 3.5–5.0)
Alkaline Phosphatase: 63 U/L (ref 38–126)
Anion gap: 14 (ref 5–15)
BUN: 12 mg/dL (ref 6–20)
CO2: 20 mmol/L — ABNORMAL LOW (ref 22–32)
Calcium: 9 mg/dL (ref 8.9–10.3)
Chloride: 101 mmol/L (ref 98–111)
Creatinine, Ser: 1.46 mg/dL — ABNORMAL HIGH (ref 0.44–1.00)
GFR calc Af Amer: 46 mL/min — ABNORMAL LOW (ref >60)
GFR calc non Af Amer: 39 mL/min — ABNORMAL LOW (ref >60)
Glucose, Bld: 153 mg/dL — ABNORMAL HIGH (ref 70–99)
Potassium: 3.9 mmol/L (ref 3.5–5.1)
Sodium: 135 mmol/L (ref 135–145)
Total Bilirubin: 0.6 mg/dL (ref 0.3–1.2)
Total Protein: 7.1 g/dL (ref 6.5–8.1)

## 2020-04-07 LAB — LIPASE, BLOOD: Lipase: 36 U/L (ref 11–51)

## 2020-04-07 LAB — SARS CORONAVIRUS 2 BY RT PCR (HOSPITAL ORDER, PERFORMED IN ~~LOC~~ HOSPITAL LAB): SARS Coronavirus 2: POSITIVE — AB

## 2020-04-07 MED ORDER — KETOROLAC TROMETHAMINE 15 MG/ML IJ SOLN
15.0000 mg | Freq: Once | INTRAMUSCULAR | Status: AC
  Administered 2020-04-07: 15 mg via INTRAVENOUS
  Filled 2020-04-07: qty 1

## 2020-04-07 MED ORDER — DEXAMETHASONE SODIUM PHOSPHATE 10 MG/ML IJ SOLN
10.0000 mg | Freq: Once | INTRAMUSCULAR | Status: AC
  Administered 2020-04-07: 10 mg via INTRAVENOUS
  Filled 2020-04-07: qty 1

## 2020-04-07 MED ORDER — SODIUM CHLORIDE 0.9 % IV BOLUS
1000.0000 mL | Freq: Once | INTRAVENOUS | Status: AC
  Administered 2020-04-07: 1000 mL via INTRAVENOUS

## 2020-04-07 MED ORDER — PROCHLORPERAZINE EDISYLATE 10 MG/2ML IJ SOLN
10.0000 mg | Freq: Once | INTRAMUSCULAR | Status: AC
  Administered 2020-04-07: 10 mg via INTRAVENOUS
  Filled 2020-04-07: qty 2

## 2020-04-07 MED ORDER — DIPHENHYDRAMINE HCL 50 MG/ML IJ SOLN
25.0000 mg | Freq: Once | INTRAMUSCULAR | Status: AC
  Administered 2020-04-07: 25 mg via INTRAVENOUS
  Filled 2020-04-07: qty 1

## 2020-04-07 MED ORDER — ONDANSETRON HCL 4 MG PO TABS: 4.0000 mg | ORAL_TABLET | Freq: Once | ORAL | Status: DC

## 2020-04-07 MED ORDER — ONDANSETRON 4 MG PO TBDP
4.0000 mg | ORAL_TABLET | Freq: Once | ORAL | Status: AC
  Administered 2020-04-07: 4 mg via ORAL
  Filled 2020-04-07: qty 1

## 2020-04-07 NOTE — ED Provider Notes (Addendum)
[facility] EMERGENCY DEPARTMENT Provider Note   CSN: [id number] Arrival date & time: 04/07/20  0941   History Chief Complaint  Patient presents with  . Migraine  . Flank Pain  . Leg Pain    Gloria Martin is a 59 y.o. female with past medical history who presents for evaluation of multiple complaints. Patient states she was cleaning a house on Friday. House was non ventilated. States she was cleaning with bleach. Since then she has had a headache, nausea, 3 episodes of diarrhea, non productive cough and all over cramping. Does states she has history of migraine headaches and says this feels similar.  She is taken 2 doses of Tylenol at home without relief.  She denies any sudden onset thunderclap headache.  She has no vision changes, photophobia, phonophobia, facial droop, difficulty with word finding, paresthesias, weakness.  Patient states she is normally followed by her PCP for her headaches.  Patient states she has not had any episodes of this has however had intermittent episodes of nausea.  She also had 3 episodes of diarrhea since Friday.  She denies any melena, bright red blood per rectum.  No associated abdominal pain, dysuria, recent antibiotics or travel.  She denies any chronic alcohol use, NSAIDs.  She has been able to keep down her medications as well as p.o. intake at home.  Patient with nonproductive cough.  She has no chest pain, shortness of breath or hemoptysis.  Was seen by her PCP yesterday and told her this was likely inhalation pneumonitis and started on antibiotics and steroids.  She has not filled these.  She is not vaccinated for Covid denies any known Covid exposures.  Patient also states she has had all over cramping, worse to her bilateral lower legs times years.  She states she has been followed by her PCP for this.  These have not changed in duration.  These are located to her bilateral legs and occasionally her upper arms.  There is no associated  paresthesias, weakness.  No recent injury or trauma.  She is taking muscle relaxers for this.  Patient states she did have an episode of "wooziness" while in triage earlier today.  She denies any change in her headache, chest pain or shortness of breath at that time. No syncope activity.  Patient denies fever, lightheadedness, dizziness, neck pain, neck stiffness, chest pain, abdominal pain, lateral leg swelling, redness, warmth, recent surgery, immobilization, history of PE, DVT.\   History obtained from patient and past medical records.  No interpreter used.   HPI     Past Medical History:  Diagnosis Date  . Hypertension   . Leg cramps   . Obesity     Patient Active Problem List   Diagnosis Date Noted  . Chest pain 08/09/2017  . Overweight 08/09/2017  . Essential hypertension 08/09/2017    Past Surgical History:  Procedure Laterality Date  . TUBAL LIGATION       OB History   No obstetric history on file.     Family History  Problem Relation Age of Onset  . Hypertension Mother   . Hypertension Brother     Social History   Tobacco Use  . Smoking status: Current Every Day Smoker    Packs/day: 1.00    Types: Cigarettes  . Smokeless tobacco: Never Used  Vaping Use  . Vaping Use: Never used  Substance Use Topics  . Alcohol use: No  . Drug use: No    Home Medications Prior  to Admission medications   Medication Sig Start Date End Date Taking? Authorizing Provider  aspirin-acetaminophen-caffeine (EXCEDRIN MIGRAINE) [phone] MG tablet Take 1 tablet by mouth every 6 (six) hours as needed for headache.    [provider]  carisoprodol (SOMA) 350 MG tablet Take 350 mg by mouth 3 (three) times daily as needed. 06/18/17   [provider]  losartan-hydrochlorothiazide (HYZAAR) 100-25 MG tablet Take 1 tablet by mouth daily. 07/14/17   [provider]    Allergies    Patient has no known allergies.  Review of Systems   Review of  Systems  HENT: Negative.   Respiratory: Positive for cough. Negative for apnea, choking, chest tightness, shortness of breath, wheezing and stridor.   Cardiovascular: Negative for chest pain, palpitations and leg swelling.  Gastrointestinal: Positive for diarrhea and nausea. Negative for abdominal distention, abdominal pain, anal bleeding, blood in stool, constipation, rectal pain and vomiting.  Genitourinary: Negative.   Musculoskeletal: Positive for back pain and myalgias (Chronic myaligas, cramping to BLE intermittent BUE).  Skin: Negative.   Neurological: Positive for headaches. Negative for dizziness, tremors, seizures, syncope, facial asymmetry, speech difficulty, weakness, light-headedness and numbness.  All other systems reviewed and are negative.   Physical Exam Updated Vital Signs BP 121/83   Pulse 92   Temp 97.9 F (36.6 C) (Oral)   Resp 16   SpO2 98%   Physical Exam Vitals and nursing note reviewed.  Constitutional:      General: She is not in acute distress.    Appearance: She is not ill-appearing, toxic-appearing or diaphoretic.  HENT:     Head: Normocephalic and atraumatic.     Jaw: There is normal jaw occlusion.     Right Ear: Tympanic membrane, ear canal and external ear normal. There is no impacted cerumen. No hemotympanum. Tympanic membrane is not injected, scarred, perforated, erythematous, retracted or bulging.     Left Ear: Tympanic membrane, ear canal and external ear normal. There is no impacted cerumen. No hemotympanum. Tympanic membrane is not injected, scarred, perforated, erythematous, retracted or bulging.     Ears:     Comments: No Mastoid tenderness.    Nose: Nose normal.     Comments: No rhinorrhea and congestion to bilateral nares.  No sinus tenderness.    Mouth/Throat:     Lips: Pink.     Mouth: Mucous membranes are moist.     Pharynx: Oropharynx is clear. Uvula midline.     Comments: Posterior oropharynx clear.  Mucous membranes moist.   Tonsils without erythema or exudate.  Uvula midline without deviation. No drooling, dysphasia or trismus.  Phonation normal. Neck:     Trachea: Trachea and phonation normal.     Meningeal: Brudzinski's sign and Kernig's sign absent.     Comments: No Neck stiffness or neck rigidity.  No meningismus.  No cervical lymphadenopathy. Cardiovascular:     Pulses: Normal pulses.          Radial pulses are 2+ on the right side and 2+ on the left side.       Dorsalis pedis pulses are 2+ on the right side and 2+ on the left side.       Posterior tibial pulses are 2+ on the right side and 2+ on the left side.     Heart sounds: Normal heart sounds.     Comments: No murmurs rubs or gallops. Pulmonary:     Comments: Clear to auscultation bilaterally without wheeze, rhonchi or rales.  No accessory  muscle usage.  Able speak in full sentences. Chest:     Comments: Equal rise and fall to chest wall.  No crepitus, step-offs or tenderness Abdominal:     General: Bowel sounds are normal.     Palpations: Abdomen is soft.     Tenderness: There is no abdominal tenderness. There is no right CVA tenderness, left CVA tenderness, guarding or rebound.     Hernia: No hernia is present.     Comments: Soft, nontender without rebound or guarding.  No CVA tenderness.  No pulsatile midline abdominal mass  Musculoskeletal:     Comments: Moves all 4 extremities without difficulty.  Lower extremities without edema, erythema or warmth.  Compartments soft.  Denna Haggard' sign negative.  Feet:     Right foot:     Toenail Condition: Right toenails are abnormally thick.     Left foot:     Toenail Condition: Left toenails are abnormally thick and long.  Skin:    General: Skin is warm.     Capillary Refill: Capillary refill takes less than 2 seconds.     Comments: Brisk capillary refill.  No rashes or lesions.  Tactile temperature to extremities  Neurological:     Mental Status: She is alert.     Comments: Mental Status:  Alert,  oriented, thought content appropriate. Speech fluent without evidence of aphasia. Able to follow 2 step commands without difficulty.  Cranial Nerves:  II:  Peripheral visual fields grossly normal, pupils equal, round, reactive to light III,IV, VI: ptosis not present, extra-ocular motions intact bilaterally  V,VII: smile symmetric, facial light touch sensation equal VIII: hearing grossly normal bilaterally  IX,X: midline uvula rise  XI: bilateral shoulder shrug equal and strong XII: midline tongue extension  Motor:  5/5 in upper and lower extremities bilaterally including strong and equal grip strength and dorsiflexion/plantar flexion Sensory: Pinprick and light touch normal in all extremities.  Deep Tendon Reflexes: 2+ and symmetric  Cerebellar: normal finger-to-nose with bilateral upper extremities Gait: normal gait and balance CV: distal pulses palpable throughout      ED Results / Procedures / Treatments   Labs (all labs ordered are listed, but only abnormal results are displayed) Labs Reviewed  COMPREHENSIVE METABOLIC PANEL - Abnormal; Notable for the following components:      Result Value   CO2 20 (*)    Glucose, Bld 153 (*)    Creatinine, Ser 1.46 (*)    AST 68 (*)    ALT 55 (*)    GFR calc non Af Amer 39 (*)    GFR calc Af Amer 46 (*)    All other components within normal limits  CBC - Abnormal; Notable for the following components:   Hemoglobin 15.5 (*)    All other components within normal limits  SARS CORONAVIRUS 2 BY RT PCR (HOSPITAL ORDER, PERFORMED IN [facility] LAB)  LIPASE, BLOOD  URINALYSIS, ROUTINE W REFLEX MICROSCOPIC    EKG EKG Interpretation  Date/Time:  Wednesday April 07 2020 15:38:26 EDT Ventricular Rate:  83 PR Interval:    QRS Duration: 96 QT Interval:  353 QTC Calculation: 415 R Axis:   3 Text Interpretation: Sinus rhythm Low voltage, precordial leads No STEMI Confirmed by Alvester Chou [phone]) on 04/07/2020 4:19:00  PM   Radiology DG Chest 2 View  Result Date: 04/07/2020 CLINICAL DATA:  Shortness of breath. EXAM: CHEST - 2 VIEW COMPARISON:  02/03/2020 FINDINGS: The cardiac silhouette, mediastinal and hilar contours are within normal limits and  stable. The lungs are clear of an acute process. Mild eventration of the right hemidiaphragm. No pleural effusions. No pulmonary lesions. The bony thorax is intact. IMPRESSION: No acute cardiopulmonary findings. Electronically Signed   By: Rudie Meyer M.D.   On: 04/07/2020 12:55   Procedures Procedures (including critical care time)  Medications Ordered in ED Medications  ondansetron (ZOFRAN-ODT) disintegrating tablet 4 mg (4 mg Oral Given 04/07/20 1036)  sodium chloride 0.9 % bolus 1,000 mL (1,000 mLs Intravenous New Bag/Given 04/07/20 1527)  ketorolac (TORADOL) 15 MG/ML injection 15 mg (15 mg Intravenous Given 04/07/20 1527)  prochlorperazine (COMPAZINE) injection 10 mg (10 mg Intravenous Given 04/07/20 1527)  diphenhydrAMINE (BENADRYL) injection 25 mg (25 mg Intravenous Given 04/07/20 1526)  dexamethasone (DECADRON) injection 10 mg (10 mg Intravenous Given 04/07/20 1527)   ED Course  I have reviewed the triage vital signs and the nursing notes.  Pertinent labs & imaging results that were available during my care of the patient were reviewed by me and considered in my medical decision making (see chart for details).  Unfortunately, patient with wait time in lobby at >40  hours  59 year old female presents for evaluation of multiple complaints.  She is afebrile, nonseptic, not ill-appearing.  Currently patient was cleaning in house on Friday, 5 days PTA when she developed headache, cough, myalgias.  She is also had 3 episodes of diarrhea over the last 5 days however no melena or blood per rectum.  No associated abdominal pain.  No sudden onset thunderclap headache.  She has a nonfocal neuro exam without deficits.  No chest pain, shortness of breath, hemoptysis.  No  unilateral leg swelling, redness or warmth.  Clinically no evidence of DVT on exam.  Patient did have episode of "wooziness" while in triage.  Was noted to be hypotensive at that time.  Patient denies any focal neurologic complaints, chest pain or shortness of breath at that time.  Initial evaluation patient systolic blood pressure 121.  Patient without back pain, flank pain, equal pulses distally.  I low suspicion for dissection as cause of her hypotension. No CP, SOB low suspicion for PE, ACS.  Labs started from triage  Labs imaging personally reviewed and interpreted:  CBC without leukocytosis, hemoglobin 15.5, question hemoconcentration  Metabolic panel with CO2 at 20, hyperglycemia at 153, creatinine 1.46, previous 1.22, mild elevation in AST, ALT at 68,55 respectively Lipase 36 DG chest without acute infiltrates, cardiomegaly, pleural edema, pneumothorax COVID pending EKG without ST changes  Patient reassessed.  States her migraine has completely resolved.  States "I have not felt this good in about a week."  She is ambulatory without any hypoxia without ataxic gait.  Patient requesting discharge.  She continues have nonfocal neuro exam without deficits.  She appears overall well.  Will have her follow-up with her PCP.  Is tolerating p.o. intake here in emergency department without difficulty.  Reassessment further blood pressures with systolic greater than 120's.  Reevaluation her abdomen is soft, nontender.  Question if patient's headache was her known migraine.  She has no symptoms of her headache suggestive of  Presentation non concerning for SAH, dissection, CVA, ICH, Meningitis, or temporal arteritis. Pt is afebrile with no focal neuro deficits, nuchal rigidity, or change in vision. On repeat exam patient does not have a surgical abdomin and there are no peritoneal signs.  No indication of appendicitis, bowel obstruction, bowel perforation, cholecystitis, diverticulitis, PID or ectopic  pregnancy, AAA.   Questions patient prior lightheaded episode due to  to vasovagal in nature.  Discussed increase fluids, close watch of her blood pressure and return for any worsening symptoms.  The patient has been appropriately medically screened and/or stabilized in the ED. I have low suspicion for any other emergent medical condition which would require further screening, evaluation or treatment in the ED or require inpatient management.  Patient is hemodynamically stable and in no acute distress.  Patient able to ambulate in department prior to ED.  Evaluation does not show acute pathology that would require ongoing or additional emergent interventions while in the emergency department or further inpatient treatment.  I have discussed the diagnosis with the patient and answered all questions.  Pain is been managed while in the emergency department and patient has no further complaints prior to discharge.  Patient is comfortable with plan discussed in room and is stable for discharge at this time.  I have discussed strict return precautions for returning to the emergency department.  Patient was encouraged to follow-up with PCP/specialist refer to at discharge.  Patient discussed with attending, Dr. Renaye Rakers who agrees above treatment, plan and disposition   ADDEND: Patient did not want to wait for COVID results prior to dc home. Testing resulted POSITIVE for COVID. Patient was still in lobby waiting for ride. Nursing discussed results with patient.    MDM Rules/Calculators/A&P                          Haili Donofrio was evaluated in Emergency Department on 04/07/2020 for the symptoms described in the history of present illness. She was evaluated in the context of the global COVID-19 pandemic, which necessitated consideration that the patient might be at risk for infection with the SARS-CoV-2 virus that causes COVID-19. Institutional protocols and algorithms that pertain to the evaluation of patients  at risk for COVID-19 are in a state of rapid change based on information released by regulatory bodies including the CDC and federal and state organizations. These policies and algorithms were followed during the patient's care in the ED. Final Clinical Impression(s) / ED Diagnoses Final diagnoses:  Migraine without aura and without status migrainosus, not intractable  Nausea    Rx / DC Orders ED Discharge Orders    None       Nobuko Gsell A, PA-C 04/07/20 1654    Echo Propp A, PA-C 04/07/20 1729    Terald Sleeper, MD 04/07/20 2043

## 2020-04-07 NOTE — ED Notes (Signed)
Patient already DC but still in lobby waiting for ride. Informed patient of COVID+ results

## 2020-04-07 NOTE — ED Triage Notes (Signed)
Pt presents with multiple complaints: reports migraine headache since cleaning her house with Clor[employer] over the weekend, bilateral flank pain, diarrhea, and bilateral leg cramps for a few days. Went to her doctor yesterday who told her she had infection in her lungs and sent a rx that the pt has not yet picked up. During triage pt becoming dizzy, diaphoretic, and nauseas.

## 2020-04-07 NOTE — Discharge Instructions (Signed)
Take the medications given to you by your primary care provider  Return for any worsening symptoms.

## 2020-04-08 ENCOUNTER — Telehealth: Payer: Self-pay | Admitting: Internal Medicine

## 2020-04-08 NOTE — Telephone Encounter (Signed)
Called to Discuss with patient about Covid symptoms and the use of the monoclonal antibody infusion for those with mild to moderate Covid symptoms and at a high risk of hospitalization.     Pt appears to qualify for this infusion due to co-morbid conditions and/or a member of an at-risk group in accordance with the FDA Emergency Use Authorization.    Unable to reach pt and unable to leave VM. Mychart message sent with infusion hotline number to call if pt interested.   Marcy Salvo, NP [facility]

## 2020-04-09 ENCOUNTER — Telehealth (HOSPITAL_COMMUNITY): Payer: Self-pay

## 2020-04-13 ENCOUNTER — Telehealth (HOSPITAL_COMMUNITY): Payer: Self-pay

## 2020-10-13 ENCOUNTER — Other Ambulatory Visit (HOSPITAL_COMMUNITY): Payer: Self-pay | Admitting: Urgent Care

## 2020-10-13 DIAGNOSIS — I739 Peripheral vascular disease, unspecified: Secondary | ICD-10-CM

## 2020-10-14 ENCOUNTER — Ambulatory Visit (HOSPITAL_COMMUNITY)
Admission: RE | Admit: 2020-10-14 | Discharge: 2020-10-14 | Disposition: A | Discharge status: Home / Self Care | Payer: Self-pay | Source: Ambulatory Visit | Attending: Urgent Care | Admitting: Urgent Care

## 2020-10-14 ENCOUNTER — Other Ambulatory Visit: Payer: Self-pay

## 2020-10-14 DIAGNOSIS — I739 Peripheral vascular disease, unspecified: Secondary | ICD-10-CM | POA: Insufficient documentation

## 2020-10-17 DIAGNOSIS — R231 Pallor: Secondary | ICD-10-CM

## 2020-10-17 HISTORY — DX: Pallor: R23.1

## 2020-10-18 ENCOUNTER — Encounter (HOSPITAL_COMMUNITY): Payer: Self-pay

## 2020-10-18 ENCOUNTER — Telehealth (HOSPITAL_COMMUNITY): Payer: Self-pay

## 2020-10-18 NOTE — Telephone Encounter (Signed)
Gloria Martin was really concerned about having her ultrasound due to having no insurance, I did bring to her attention out the [employer] Program that we offer at [facility], she brought all proper paper work in and completed form. I reached out to [facility]  [facility]) to see where forms needed to be sent. She advised me to send them to [facility] business office attention: customer service. So I sent them in an interoffice mail.    [employer]

## 2021-03-30 ENCOUNTER — Encounter: Payer: Self-pay | Admitting: Internal Medicine

## 2021-03-30 ENCOUNTER — Ambulatory Visit (INDEPENDENT_AMBULATORY_CARE_PROVIDER_SITE_OTHER): Payer: Self-pay | Admitting: Internal Medicine

## 2021-03-30 ENCOUNTER — Ambulatory Visit (HOSPITAL_COMMUNITY)
Admission: RE | Admit: 2021-03-30 | Discharge: 2021-03-30 | Disposition: A | Discharge status: Home / Self Care | Payer: Self-pay | Source: Ambulatory Visit | Attending: Internal Medicine | Admitting: Internal Medicine

## 2021-03-30 ENCOUNTER — Other Ambulatory Visit: Payer: Self-pay

## 2021-03-30 VITALS — BP 111/66 | HR 112 | Ht 64.5 in | Wt 262.0 lb

## 2021-03-30 DIAGNOSIS — R231 Pallor: Secondary | ICD-10-CM

## 2021-03-30 DIAGNOSIS — G8929 Other chronic pain: Secondary | ICD-10-CM | POA: Insufficient documentation

## 2021-03-30 DIAGNOSIS — R252 Cramp and spasm: Secondary | ICD-10-CM

## 2021-03-30 DIAGNOSIS — M5442 Lumbago with sciatica, left side: Secondary | ICD-10-CM

## 2021-03-30 DIAGNOSIS — M5441 Lumbago with sciatica, right side: Secondary | ICD-10-CM

## 2021-03-30 LAB — HEPATITIS B SURFACE ANTIBODY,QUALITATIVE: Hep B S Ab: NONREACTIVE

## 2021-03-30 LAB — SEDIMENTATION RATE: Sed Rate: 18 mm/hr (ref 0–22)

## 2021-03-30 LAB — HEPATITIS B CORE ANTIBODY, IGM: Hep B C IgM: NONREACTIVE

## 2021-03-30 LAB — C-REACTIVE PROTEIN: CRP: 1 mg/dL — ABNORMAL HIGH (ref <1.0)

## 2021-03-30 LAB — HEPATITIS C ANTIBODY: HCV Ab: NONREACTIVE

## 2021-03-30 LAB — HEPATITIS B SURFACE ANTIGEN: Hepatitis B Surface Ag: NONREACTIVE

## 2021-03-30 NOTE — Progress Notes (Signed)
Office Visit Note  Patient: Gloria Martin             Date of Birth: Jun 12, 1961           MRN: [id number]             PCP: Chaney Malling, PA Referring: Chaney Malling, [place] Visit Date: 03/30/2021   Subjective:  Numbness and Pain of the Left Leg (Has had leg cramps x 2 years. Getting worse. Patient the left leg is worse than the right. Patient has numbness that goes down to her toes. Patient describes a "cool burning sensation" in her leg. Patient states she also has sharp pains. Swelling every once in a while. ) and Numbness and Pain of the Right Leg (Patient has numbness that goes down to her toes. Patient describes a "cool burning sensation" in her leg. Patient states she also has sharp pains. Swelling every once in a while. /)   History of Present Illness: Gloria Martin is a 59 y.o. female here for evaluation of skin rashes and livedo reticularis, joint pain and stiffness, and cramping leg pains. Previous workup and testing so far demonstrated negative ANA and ANCA Ab tests, ABIs with mild decreased, venous ultrasound with no blockages.  Symptoms are ongoing for a few years with frequent leg cramping primarily on the left side.  However also having intermittent cool or burning type sensations sometimes numbness is going all the way down to her toes.  There is occasional swelling but not all of the time does not necessarily correlate with other symptoms. She had recent COVID illness but no particular change on these localized symptoms and these all predated. She has some chronic back pain at multiple sites no recent imaging or advanced interventions. She takes pletal not any recent change for this. She has migraines treated intermittently with NSAIDs and imitrex.  Labs reviewed 09/2020 ANCA screen neg Hgb A1c 6.0% CBC unremarkable CMP AST 42 ALT 46  Review of Systems  Constitutional:  Positive for fatigue.  HENT:  Negative for mouth sores.   Cardiovascular:  Positive for swelling in  legs/feet.  Gastrointestinal:  Negative for abdominal pain and blood in stool.  Musculoskeletal:  Positive for joint pain and joint pain.  Skin:  Positive for rash.  Neurological:  Positive for numbness and parasthesias.   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   Mixed hyperlipidemia 04/06/2020   Cramps of lower extremity 01/26/2018   Chronic low back pain 12/24/2017   Chest pain 08/09/2017   Overweight 08/09/2017   Essential hypertension 08/09/2017    Past Medical History:  Diagnosis Date   Bone spur of other site    Spine   High cholesterol    Hypertension    Leg cramps    Obesity     Family History  Problem Relation Age of Onset   Hypertension Mother    High Cholesterol Mother    Hypertension Brother    Past Surgical History:  Procedure Laterality Date   TUBAL LIGATION     WISDOM TOOTH EXTRACTION Bilateral    Social History   Social History Narrative   Not on file    There is no immunization history on file for this patient.   Objective: Vital Signs: BP 111/66 (BP Location: Right Arm, Patient Position: Sitting, Cuff Size: Large)   Pulse (!) 112   Ht 5' 4.5" (1.638 m)   Wt 262 lb (118.8  kg)   BMI 44.28 kg/m    Physical Exam Constitutional:      Appearance: She is obese.  Cardiovascular:     Rate and Rhythm: Normal rate and regular rhythm.  Pulmonary:     Effort: Pulmonary effort is normal.     Breath sounds: Normal breath sounds.  Skin:    General: Skin is warm and dry.     Comments: Livedo reticularis rash over the left knee extending part way down the lower leg no focal lesions induration or swelling  Neurological:     Mental Status: She is alert.  Psychiatric:        Mood and Affect: Mood normal.     Musculoskeletal Exam:  Shoulders full ROM no tenderness or swelling Elbows full ROM no tenderness or swelling Wrists full ROM no tenderness or swelling Fingers full ROM no tenderness  or swelling Bilateral paraspinal muscle tenderness to palpation at the lumbar spine Knees full ROM some patellofemoral crepitus and joint line tenderness worse on the left Ankles full ROM no tenderness or swelling   Investigation: No additional findings.  Imaging: DG Thoracic Spine 2 View  Result Date: 03/31/2021 CLINICAL DATA:  Back pain EXAM: THORACIC SPINE 2 VIEWS COMPARISON:  None. FINDINGS: Thoracic alignment within normal limits. Vertebral body heights are maintained. Multiple level degenerative osteophyte with mild disc space narrowing IMPRESSION: Degenerative changes.  No acute osseous abnormality Electronically Signed   By: Donavan Foil M.D.   On: 03/31/2021 21:34   DG Lumbar Spine 2-3 Views  Result Date: 03/31/2021 CLINICAL DATA:  Back pain EXAM: LUMBAR SPINE - 2-3 VIEW COMPARISON:  None. FINDINGS: Five lumbar type vertebra. Alignment within normal limits. Minimal chronic appearing wedging of T12. Moderate degenerative changes at T11-T12 and T12-L1. Mild degenerative osteophytes throughout the lumbar spine. Aortic atherosclerosis IMPRESSION: Mild degenerative changes. Electronically Signed   By: Donavan Foil M.D.   On: 03/31/2021 21:36    Recent Labs: Lab Results  Component Value Date   WBC 5.4 04/07/2020   HGB 15.5 (H) 04/07/2020   PLT 212 04/07/2020   NA 135 04/07/2020   K 3.9 04/07/2020   CL 101 04/07/2020   CO2 20 (L) 04/07/2020   GLUCOSE 153 (H) 04/07/2020   BUN 12 04/07/2020   CREATININE 1.46 (H) 04/07/2020   BILITOT 0.6 04/07/2020   ALKPHOS 63 04/07/2020   AST 68 (H) 04/07/2020   ALT 55 (H) 04/07/2020   PROT 7.1 04/07/2020   ALBUMIN 3.6 04/07/2020   CALCIUM 9.0 04/07/2020   GFRAA 46 (L) 04/07/2020    Speciality Comments: No specialty comments available.  Procedures:  No procedures performed Allergies: Patient has no known allergies.   Assessment / Plan:     Visit Diagnoses: Livedo reticularis  Cramps of lower extremity  Chronic unilateral focal  livedo reticularis rash with no lesions or complications in the specific area.  Check serology including ESR, hep C, cryoglobulins, hep B, CRP, and ANA.  The cold or chilling sensation and cramping on both sides does not seem well explained by relation to this unilateral changes.  Chronic bilateral low back pain with bilateral sciatica   Chronic back pain and symptoms sound possibly consistent with more of a neurogenic claudication we will check x-ray of the lumbar and thoracic spine rule out obvious structural issues.    Orders: Orders Placed This Encounter  Procedures   DG Thoracic Spine 2 View   DG Lumbar Spine 2-3 Views    No orders of the defined  types were placed in this encounter.    Follow-Up Instructions: No follow-ups on file.   Collier Salina, MD  Note - This record has been created using [employer].  Chart creation errors have been sought, but may not always  have been located. Such creation errors do not reflect on  the standard of medical care.

## 2021-04-01 LAB — ANA W/REFLEX: Anti Nuclear Antibody (ANA): NEGATIVE

## 2021-04-04 LAB — CRYOGLOBULIN

## 2021-06-14 ENCOUNTER — Other Ambulatory Visit (HOSPITAL_COMMUNITY)
Admission: RE | Admit: 2021-06-14 | Discharge: 2021-06-14 | Disposition: A | Discharge status: Home / Self Care | Payer: Self-pay | Source: Ambulatory Visit | Attending: Urgent Care | Admitting: Urgent Care

## 2021-06-14 DIAGNOSIS — R0789 Other chest pain: Secondary | ICD-10-CM | POA: Insufficient documentation

## 2021-06-14 LAB — TROPONIN I (HIGH SENSITIVITY): Troponin I (High Sensitivity): 3 ng/L (ref <18)

## 2021-07-09 ENCOUNTER — Encounter (HOSPITAL_COMMUNITY): Payer: Self-pay

## 2021-07-09 ENCOUNTER — Emergency Department (HOSPITAL_COMMUNITY)
Admission: EM | Admit: 2021-07-09 | Discharge: 2021-07-10 | Disposition: A | Discharge status: Home / Self Care | Payer: Self-pay | Attending: Emergency Medicine | Admitting: Emergency Medicine

## 2021-07-09 ENCOUNTER — Emergency Department (HOSPITAL_COMMUNITY): Payer: Self-pay

## 2021-07-09 DIAGNOSIS — Z79899 Other long term (current) drug therapy: Secondary | ICD-10-CM | POA: Insufficient documentation

## 2021-07-09 DIAGNOSIS — N200 Calculus of kidney: Secondary | ICD-10-CM | POA: Insufficient documentation

## 2021-07-09 DIAGNOSIS — Z87891 Personal history of nicotine dependence: Secondary | ICD-10-CM | POA: Insufficient documentation

## 2021-07-09 DIAGNOSIS — I10 Essential (primary) hypertension: Secondary | ICD-10-CM | POA: Insufficient documentation

## 2021-07-09 LAB — CBC WITH DIFFERENTIAL/PLATELET
Abs Immature Granulocytes: 0.06 10*3/uL (ref 0.00–0.07)
Basophils Absolute: 0.1 10*3/uL (ref 0.0–0.1)
Basophils Relative: 0 %
Eosinophils Absolute: 0 10*3/uL (ref 0.0–0.5)
Eosinophils Relative: 0 %
HCT: 46.2 % — ABNORMAL HIGH (ref 36.0–46.0)
Hemoglobin: 15.3 g/dL — ABNORMAL HIGH (ref 12.0–15.0)
Immature Granulocytes: 0 %
Lymphocytes Relative: 6 %
Lymphs Abs: 0.9 10*3/uL (ref 0.7–4.0)
MCH: 30.4 pg (ref 26.0–34.0)
MCHC: 33.1 g/dL (ref 30.0–36.0)
MCV: 91.8 fL (ref 80.0–100.0)
Monocytes Absolute: 0.5 10*3/uL (ref 0.1–1.0)
Monocytes Relative: 3 %
Neutro Abs: 13.9 10*3/uL — ABNORMAL HIGH (ref 1.7–7.7)
Neutrophils Relative %: 91 %
Platelets: 244 10*3/uL (ref 150–400)
RBC: 5.03 MIL/uL (ref 3.87–5.11)
RDW: 12.8 % (ref 11.5–15.5)
WBC: 15.4 10*3/uL — ABNORMAL HIGH (ref 4.0–10.5)
nRBC: 0 % (ref 0.0–0.2)

## 2021-07-09 LAB — COMPREHENSIVE METABOLIC PANEL
ALT: 38 U/L (ref 0–44)
AST: 50 U/L — ABNORMAL HIGH (ref 15–41)
Albumin: 4.1 g/dL (ref 3.5–5.0)
Alkaline Phosphatase: 59 U/L (ref 38–126)
Anion gap: 12 (ref 5–15)
BUN: 17 mg/dL (ref 6–20)
CO2: 20 mmol/L — ABNORMAL LOW (ref 22–32)
Calcium: 9.3 mg/dL (ref 8.9–10.3)
Chloride: 104 mmol/L (ref 98–111)
Creatinine, Ser: 1.01 mg/dL — ABNORMAL HIGH (ref 0.44–1.00)
GFR, Estimated: >60 mL/min (ref >60)
Glucose, Bld: 163 mg/dL — ABNORMAL HIGH (ref 70–99)
Potassium: 4.9 mmol/L (ref 3.5–5.1)
Sodium: 136 mmol/L (ref 135–145)
Total Bilirubin: 2.1 mg/dL — ABNORMAL HIGH (ref 0.3–1.2)
Total Protein: 7.1 g/dL (ref 6.5–8.1)

## 2021-07-09 LAB — URINALYSIS, ROUTINE W REFLEX MICROSCOPIC
Bilirubin Urine: NEGATIVE
Glucose, UA: NEGATIVE mg/dL
Ketones, ur: 5 mg/dL — AB
Nitrite: NEGATIVE
Protein, ur: 100 mg/dL — AB
RBC / HPF: >50 RBC/hpf — ABNORMAL HIGH (ref 0–5)
Specific Gravity, Urine: 1.034 — ABNORMAL HIGH (ref 1.005–1.030)
WBC, UA: >50 WBC/hpf — ABNORMAL HIGH (ref 0–5)
pH: 5 (ref 5.0–8.0)

## 2021-07-09 LAB — LIPASE, BLOOD: Lipase: 31 U/L (ref 11–51)

## 2021-07-09 MED ORDER — OXYCODONE-ACETAMINOPHEN 5-325 MG PO TABS
1.0000 | ORAL_TABLET | Freq: Once | ORAL | Status: AC
Start: 2021-07-09 — End: 2021-07-09
  Administered 2021-07-09: 1 via ORAL
  Filled 2021-07-09: qty 1

## 2021-07-09 MED ORDER — ONDANSETRON 4 MG PO TBDP
4.0000 mg | ORAL_TABLET | Freq: Once | ORAL | Status: AC
  Administered 2021-07-09: 4 mg via ORAL
  Filled 2021-07-09: qty 1

## 2021-07-09 NOTE — ED Provider Notes (Signed)
Emergency Medicine Provider Triage Evaluation Note  Gloria Martin , a 60 y.o. female  was evaluated in triage.  Pt complains of left flank and lower abd pain that started today. Hx kidney stones.  Review of Systems  Positive: Flank pain, abd pain, nv Negative: fevers  Physical Exam  BP 96/83 (BP Location: Right Arm)    Pulse 94    Temp 98.9 F (37.2 C) (Oral)    Resp (!) 25    SpO2 98%  Gen:   Awake Resp:  Normal effort  MSK:   Moves extremities without difficulty   Medical Decision Making  Medically screening exam initiated at 5:05 PM.  Appropriate orders placed.  Gloria Martin was informed that the remainder of the evaluation will be completed by another provider, this initial triage assessment does not replace that evaluation, and the importance of remaining in the ED until their evaluation is complete.     Gloria Martin 07/09/21 1950    Gerhard Munch, MD 07/09/21 2225

## 2021-07-09 NOTE — ED Triage Notes (Signed)
Pt arrives via ems from home. Pt c/o left lower abdominal/flank pain and nausea. Hx of kidney stones

## 2021-07-10 MED ORDER — CEPHALEXIN 500 MG PO CAPS: 500.0000 mg | ORAL_CAPSULE | Freq: Three times a day (TID) | ORAL | 0 refills | Status: DC

## 2021-07-10 MED ORDER — CEPHALEXIN 500 MG PO CAPS
500.0000 mg | ORAL_CAPSULE | Freq: Once | ORAL | Status: AC
  Administered 2021-07-10: 05:00:00 500 mg via ORAL
  Filled 2021-07-10: qty 1

## 2021-07-10 MED ORDER — OXYCODONE-ACETAMINOPHEN 5-325 MG PO TABS: 1.0000 | ORAL_TABLET | Freq: Four times a day (QID) | ORAL | 0 refills | Status: DC | PRN

## 2021-07-10 NOTE — Discharge Instructions (Addendum)
Begin taking Percocet as prescribed.  Begin taking Keflex as prescribed.  Follow-up with urology if your stone has not passed in the next few days.  The contact information for [facility] has been provided in this discharge summary for you to call and make these arrangements.  Return to the emergency department in the meantime if you develop any new and/or concerning symptoms.

## 2021-07-10 NOTE — ED Provider Notes (Signed)
[facility]-EMERGENCY DEPT Provider Note   CSN: [id number] Arrival date & time: 07/09/21  1650     History Chief Complaint  Patient presents with   Flank Pain   Nausea   Abdominal Pain    Gloria Martin is a 60 y.o. female.  Patient is a 60 year old female with history of hypertension, hyperlipidemia, and renal calculi.  Patient presenting today with complaints of left flank pain.  This started earlier this morning and worsened throughout the afternoon.  Pain radiates into her left groin and is associated with urinary frequency, but no burning.  She denies any fevers or chills.  She denies any bowel complaints.  She received Percocet in the waiting room along with taking 4 ibuprofen and is feeling somewhat better.  The history is provided by the patient and the spouse.  Flank Pain This is a new problem. The current episode started 12 to 24 hours ago. The problem occurs constantly. The problem has been gradually worsening. Nothing aggravates the symptoms. Nothing relieves the symptoms. She has tried nothing for the symptoms.      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     Family History  Problem Relation Age of Onset   Hypertension Mother    High Cholesterol Mother    Hypertension Brother     Social History   Tobacco Use   Smoking status: Former    Packs/day: 1.00    Types: Cigarettes    Start date: 54     Quit date: 04/07/2020    Years since quitting: 1.2   Smokeless tobacco: Never  Vaping Use   Vaping Use: Never used  Substance Use Topics   Alcohol use: No   Drug use: No    Home Medications Prior to Admission medications   Medication Sig Start Date End Date Taking? Authorizing Provider  aspirin-acetaminophen-caffeine (EXCEDRIN MIGRAINE) [phone] MG tablet Take 1 tablet by mouth every 6 (six) hours as needed for headache.    [provider]  carisoprodol (SOMA) 350 MG tablet Take 350 mg by mouth 3 (three) times daily as needed. 06/18/17   [provider]  cilostazol (PLETAL) 50 MG tablet Take 1 tablet by mouth daily. 04/06/20   [provider]  cyclobenzaprine (FLEXERIL) 10 MG tablet Take 1 tablet by mouth at bedtime as needed.    [provider]  losartan-hydrochlorothiazide (HYZAAR) 100-25 MG tablet Take 1 tablet by mouth daily. Patient not taking: Reported on 03/30/2021 07/14/17   [provider]  meloxicam (MOBIC) 15 MG tablet Take 1 tablet by mouth as needed.    [provider]  SUMAtriptan (IMITREX) 50 MG tablet Take 1 tablet by mouth as needed.    [provider]  valsartan (DIOVAN) 160 MG tablet Take 1 tablet by mouth daily.    [provider]  Allergies    Patient has no known allergies.  Review of Systems   Review of Systems  Genitourinary:  Positive for flank pain.  All other systems reviewed and are negative.  Physical Exam Updated Vital Signs BP (!) 141/79    Pulse (!) 103    Temp 98.3 F (36.8 C) (Oral)    Resp 16    SpO2 96%   Physical Exam Vitals and nursing note reviewed.  Constitutional:      General: She is not in acute distress.    Appearance: She is well-developed. She is not diaphoretic.  HENT:     Head: Normocephalic and atraumatic.  Cardiovascular:     Rate and Rhythm: Normal rate and regular rhythm.     Heart sounds: No murmur heard.   No friction rub. No gallop.  Pulmonary:      Effort: Pulmonary effort is normal. No respiratory distress.     Breath sounds: Normal breath sounds. No wheezing.  Abdominal:     General: Bowel sounds are normal. There is no distension.     Palpations: Abdomen is soft.     Tenderness: There is abdominal tenderness in the left lower quadrant. There is left CVA tenderness. There is no guarding or rebound.  Musculoskeletal:        General: Normal range of motion.     Cervical back: Normal range of motion and neck supple.  Skin:    General: Skin is warm and dry.  Neurological:     General: No focal deficit present.     Mental Status: She is alert and oriented to person, place, and time.    ED Results / Procedures / Treatments   Labs (all labs ordered are listed, but only abnormal results are displayed) Labs Reviewed  CBC WITH DIFFERENTIAL/PLATELET - Abnormal; Notable for the following components:      Result Value   WBC 15.4 (*)    Hemoglobin 15.3 (*)    HCT 46.2 (*)    Neutro Abs 13.9 (*)    All other components within normal limits  COMPREHENSIVE METABOLIC PANEL - Abnormal; Notable for the following components:   CO2 20 (*)    Glucose, Bld 163 (*)    Creatinine, Ser 1.01 (*)    AST 50 (*)    Total Bilirubin 2.1 (*)    All other components within normal limits  URINALYSIS, ROUTINE W REFLEX MICROSCOPIC - Abnormal; Notable for the following components:   Color, Urine AMBER (*)    APPearance TURBID (*)    Specific Gravity, Urine 1.034 (*)    Hgb urine dipstick LARGE (*)    Ketones, ur 5 (*)    Protein, ur 100 (*)    Leukocytes,Ua TRACE (*)    RBC / HPF >50 (*)    WBC, UA >50 (*)    Bacteria, UA RARE (*)    All other components within normal limits  LIPASE, BLOOD    EKG None  Radiology CT Renal Stone Study  Result Date: 07/09/2021 CLINICAL DATA:  Left flank pain. EXAM: CT ABDOMEN AND PELVIS WITHOUT CONTRAST TECHNIQUE: Multidetector CT imaging of the abdomen and pelvis was performed following the standard  protocol without IV contrast. COMPARISON:  Ultrasound abdomen 10/15/2013 FINDINGS: Lower chest: No acute abnormality. Hepatobiliary: No focal liver abnormality is seen. No gallstones, gallbladder wall thickening, or biliary dilatation. Pancreas: Unremarkable. No pancreatic ductal dilatation or surrounding inflammatory changes. Spleen: Normal in size without focal abnormality. Adrenals/Urinary Tract: There is a low-density left  adrenal nodule compatible with adenoma measuring 1.5 cm. Right adrenal gland is within normal limits. There is a 1 mm calculus at the left ureterovesicular junction. There is mild left-sided hydronephrosis. There is mild left perinephric fat stranding. No other urinary tract calculi are seen. Right kidney and bladder are within normal limits. Stomach/Bowel: Stomach is within normal limits. Appendix appears normal. No evidence of bowel wall thickening, distention, or inflammatory changes. There is sigmoid colon diverticulosis without evidence for acute diverticulitis. Vascular/Lymphatic: Aortic atherosclerosis. No enlarged abdominal or pelvic lymph nodes. Reproductive: Uterus and bilateral adnexa are unremarkable. Other: There is a small fat containing umbilical hernia. There is no free fluid or free air. Musculoskeletal: No acute or significant osseous findings. IMPRESSION: 1. 1 mm calculus at the left ureterovesicular junction with mild obstructive uropathy. 2.  Sigmoid colon diverticulosis. 3.  Left adrenal adenoma. 4.  Aortic Atherosclerosis (ICD10-I70.0). Electronically Signed   By: Darliss Cheney M.D.   On: 07/09/2021 18:26    Procedures Procedures   Medications Ordered in ED Medications  cephALEXin (KEFLEX) capsule 500 mg (has no administration in time range)  oxyCODONE-acetaminophen (PERCOCET/ROXICET) 5-325 MG per tablet 1 tablet (1 tablet Oral Given 07/09/21 1833)  ondansetron (ZOFRAN-ODT) disintegrating tablet 4 mg (4 mg Oral Given 07/09/21 1833)    ED Course  I have  reviewed the triage vital signs and the nursing notes.  Pertinent labs & imaging results that were available during my care of the patient were reviewed by me and considered in my medical decision making (see chart for details).    MDM Rules/Calculators/A&P  Patient presenting here with left flank pain caused by a 1 mm renal calculus located at the left UVJ.  Patient received medication in the waiting room during a prolonged wait time and is feeling somewhat better.  Patient was offered intravenous medications, however declines.  She is anxious to go home and get into bed as she has been here for so long.  Patient's vital signs are stable and she is afebrile.  She does have a few white cells in her urine and this will be treated with Keflex.  She will also be given Percocet which she can take for pain.  She is to follow-up with urology if not improving in the next few days.  Final Clinical Impression(s) / ED Diagnoses Final diagnoses:  None    Rx / DC Orders ED Discharge Orders     None        Geoffery Lyons, MD 07/10/21 [phone]

## 2021-08-11 DIAGNOSIS — R7303 Prediabetes: Secondary | ICD-10-CM | POA: Insufficient documentation

## 2021-08-11 HISTORY — DX: Prediabetes: R73.03

## 2021-08-17 DIAGNOSIS — I739 Peripheral vascular disease, unspecified: Secondary | ICD-10-CM

## 2021-08-17 HISTORY — DX: Peripheral vascular disease, unspecified: I73.9

## 2021-09-08 NOTE — Telephone Encounter (Signed)
This encounter was created in error - please disregard.

## 2021-09-15 IMAGING — DX DG LUMBAR SPINE 2-3V
2 series · 2 of 2 positions shown · non-contrast
Comparison: None.

CLINICAL DATA: Back pain

EXAM:
LUMBAR SPINE - 2-3 VIEW

[w lumbar spine ap]
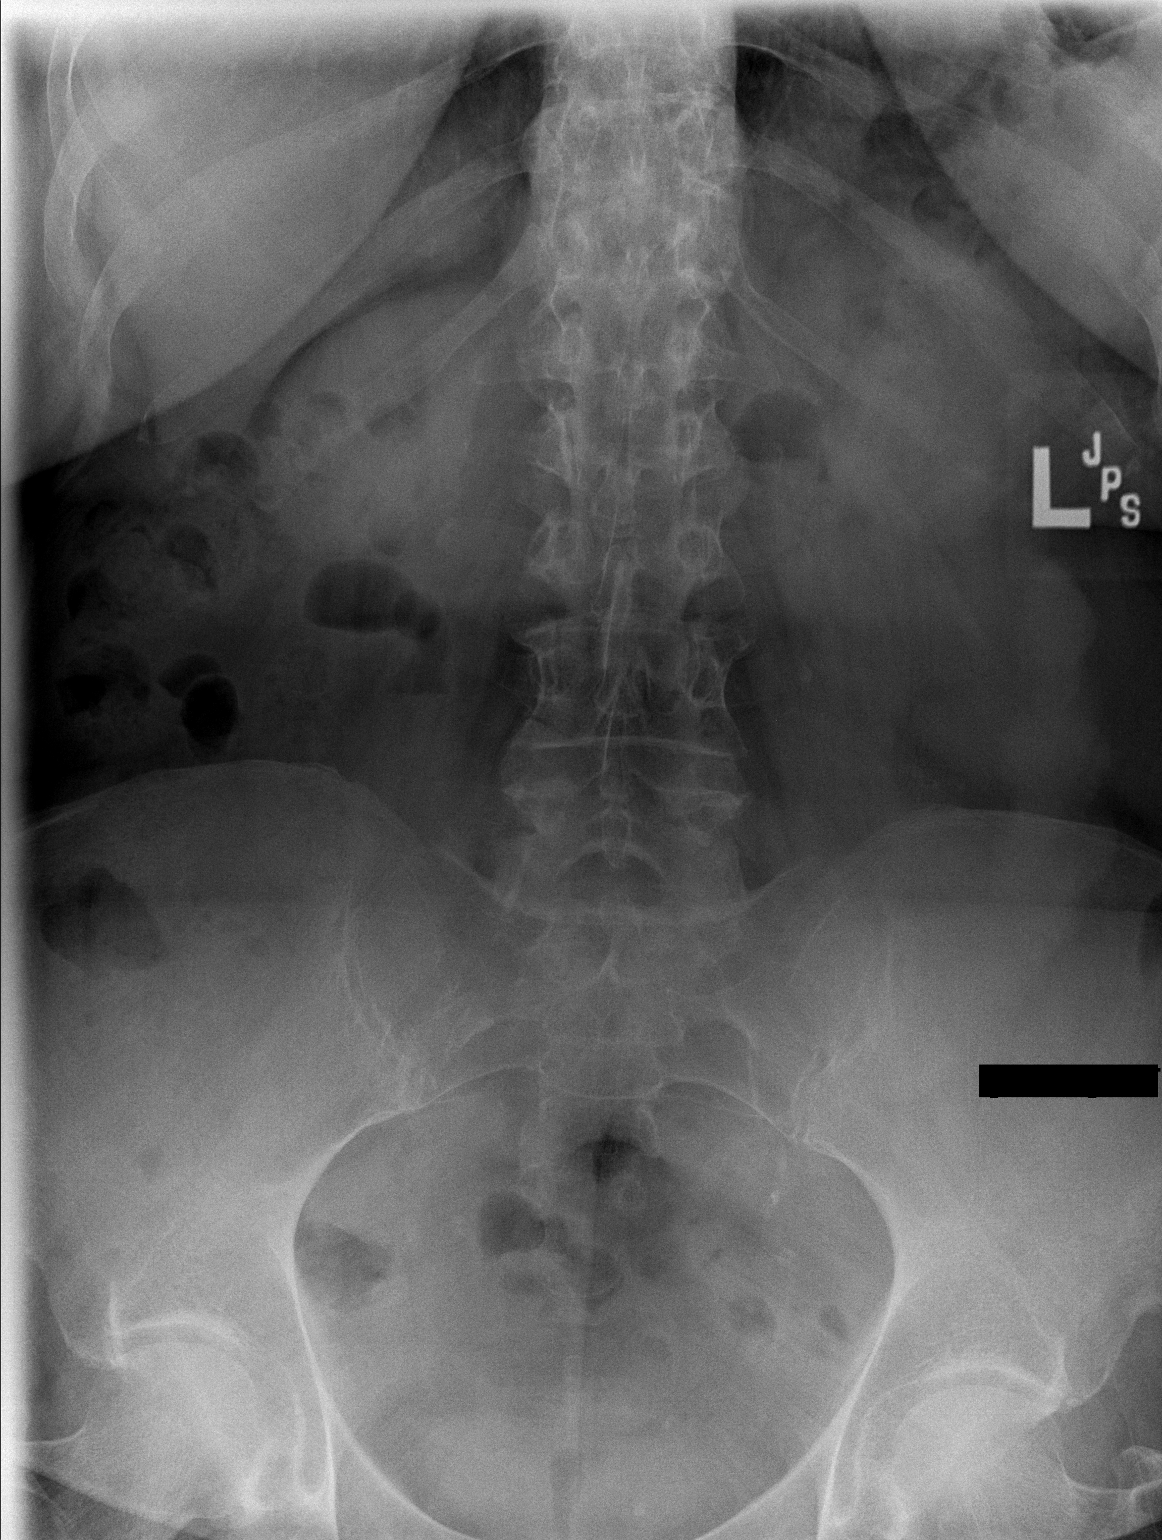

[w lumbar spine lat]
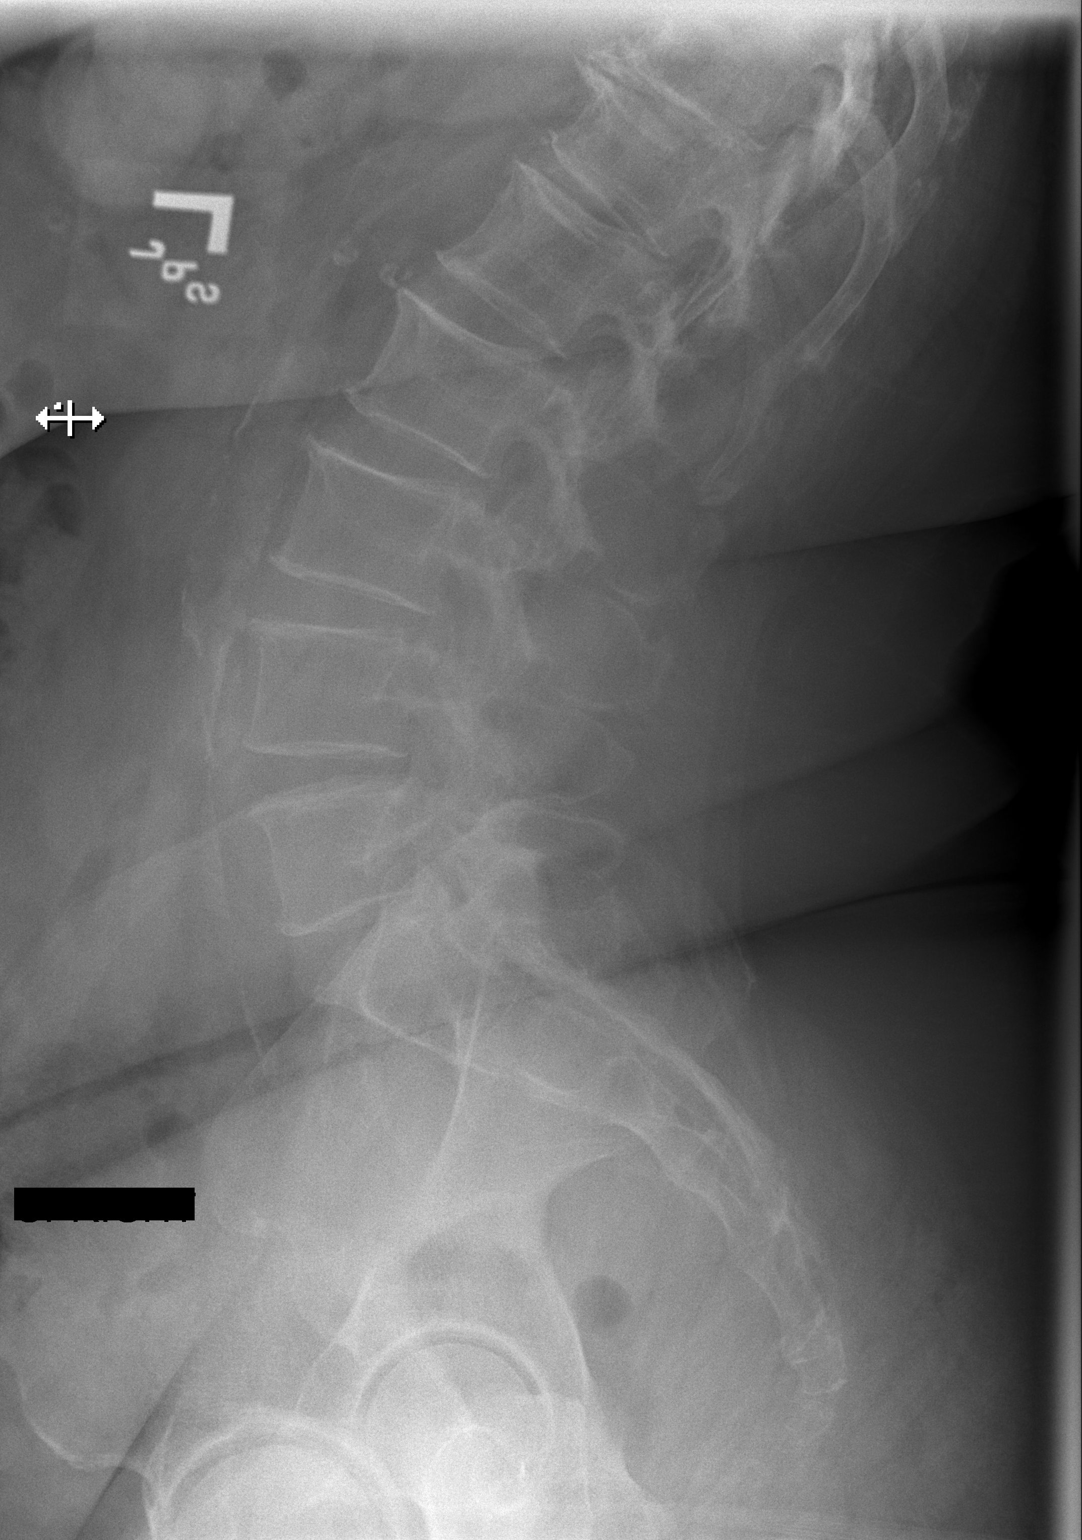

[2 of 2 positions shown; findings below may reference images not displayed]

FINDINGS: Five lumbar type vertebra. Alignment within normal limits. Minimal
chronic appearing wedging of T12. Moderate degenerative changes at
T11-T12 and T12-L1. Mild degenerative osteophytes throughout the
lumbar spine. Aortic atherosclerosis
IMPRESSION: Mild degenerative changes.

## 2021-09-15 IMAGING — DX DG THORACIC SPINE 2V
3 series · 3 of 3 positions shown · non-contrast
Comparison: None.

CLINICAL DATA: Back pain

EXAM:
THORACIC SPINE 2 VIEWS

[w thoracic spine ap]
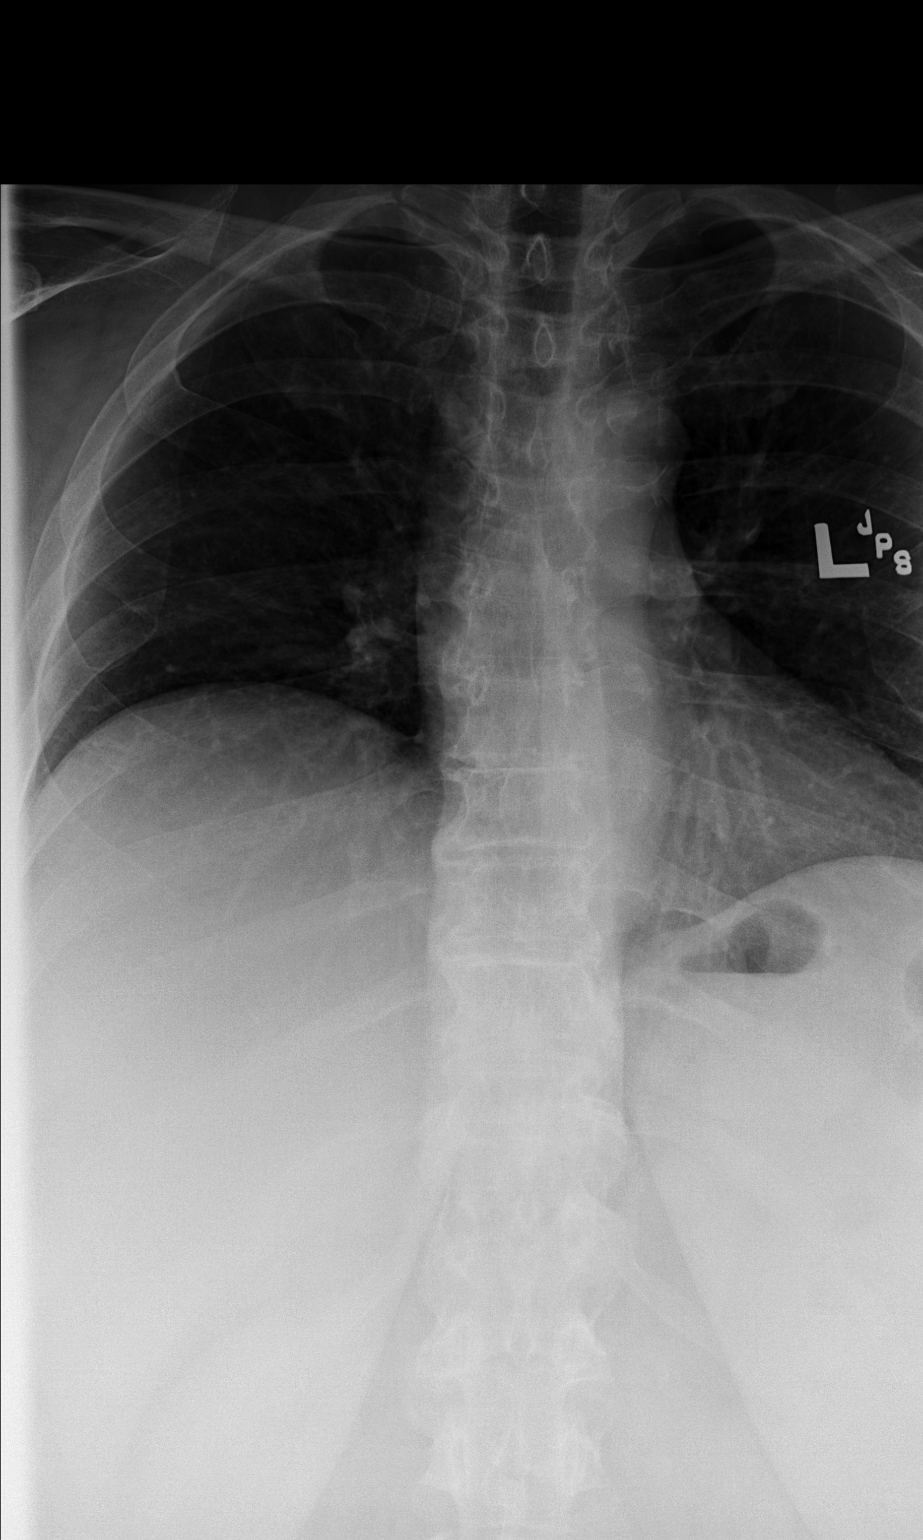

[w thoracic spine lat]
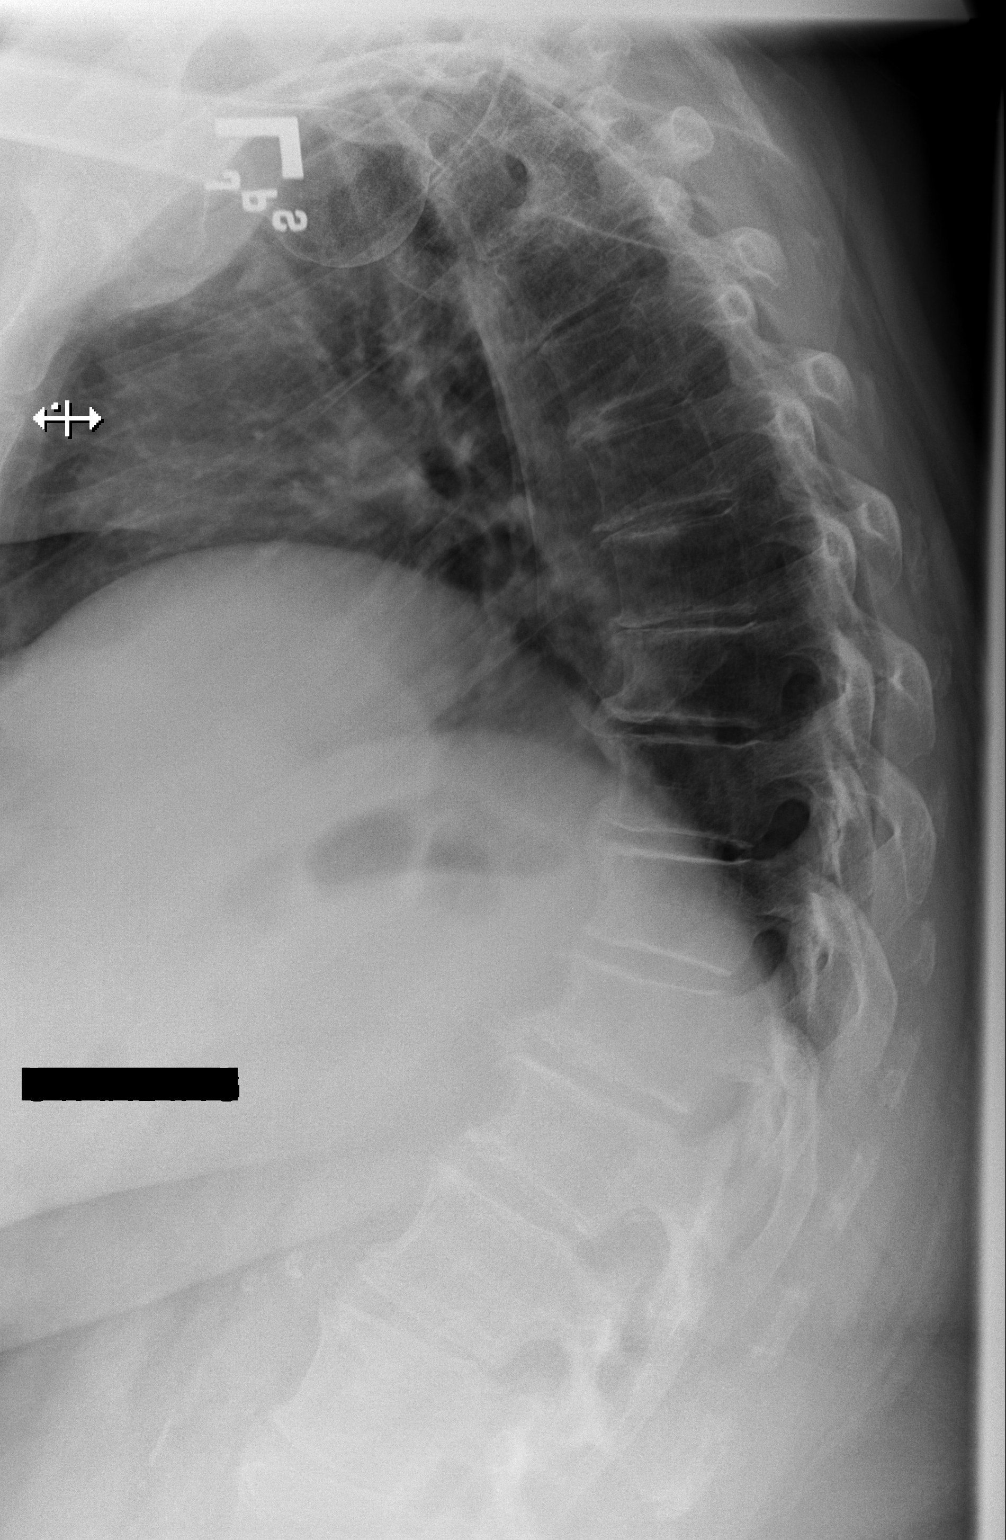

[w thoracic swimmers]
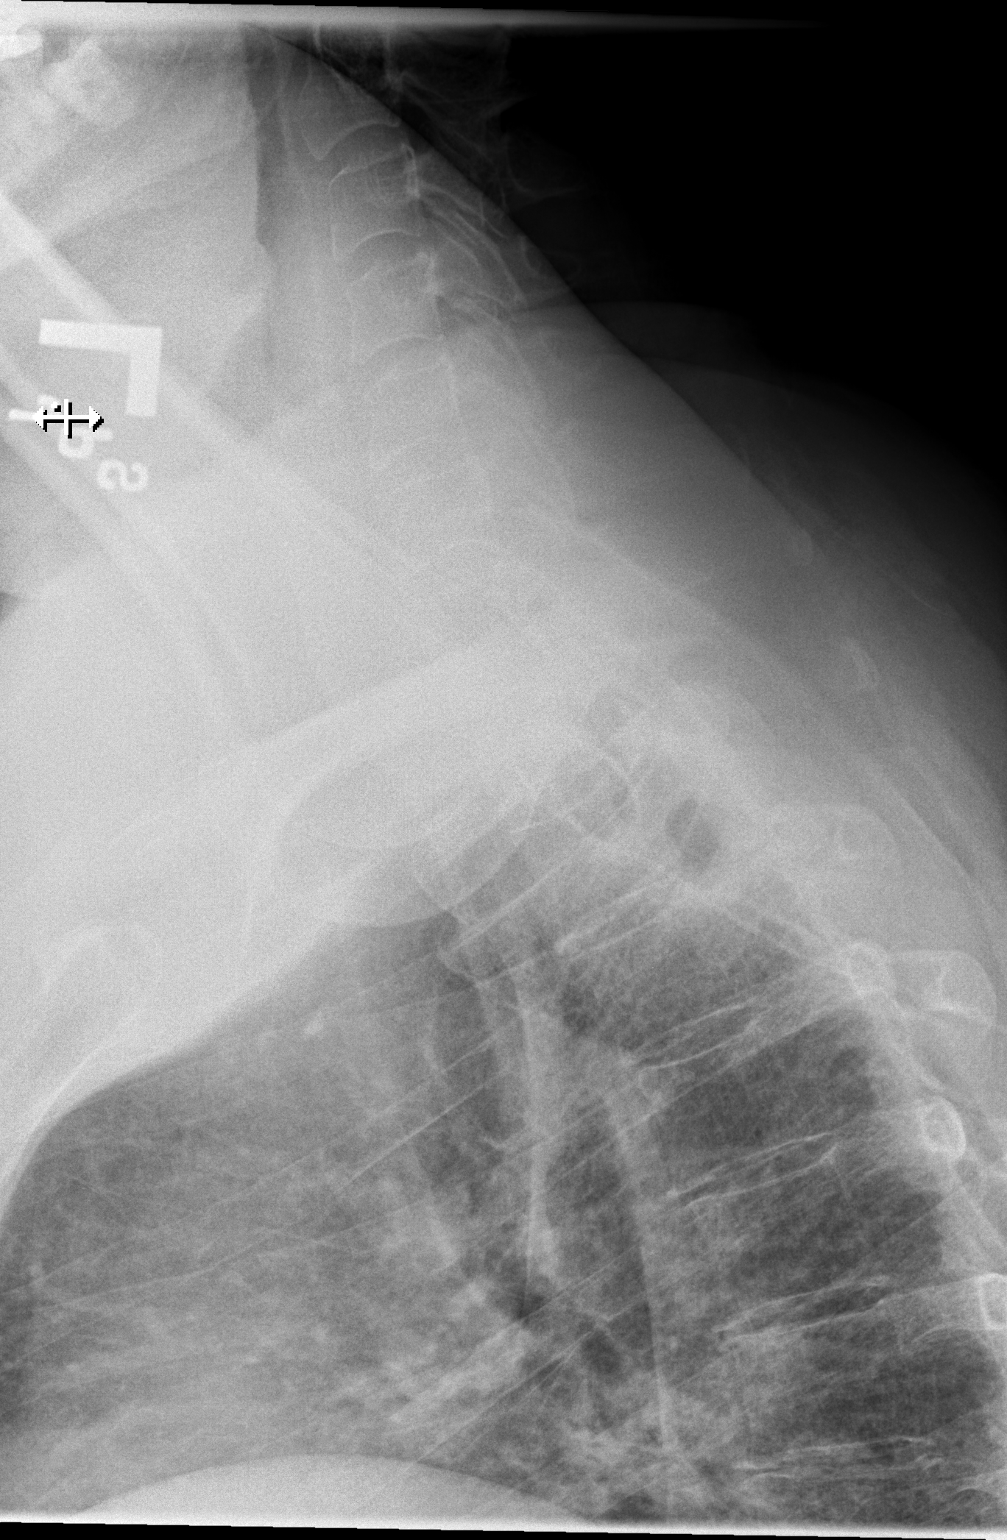

[3 of 3 positions shown; findings below may reference images not displayed]

FINDINGS: Thoracic alignment within normal limits. Vertebral body heights are
maintained. Multiple level degenerative osteophyte with mild disc
space narrowing
IMPRESSION: Degenerative changes.  No acute osseous abnormality

## 2021-10-06 ENCOUNTER — Encounter: Payer: Self-pay | Admitting: Nurse Practitioner

## 2021-10-06 ENCOUNTER — Ambulatory Visit (INDEPENDENT_AMBULATORY_CARE_PROVIDER_SITE_OTHER): Payer: 59 | Admitting: Nurse Practitioner

## 2021-10-06 ENCOUNTER — Other Ambulatory Visit: Payer: Self-pay

## 2021-10-06 VITALS — BP 138/81 | HR 121 | Resp 16 | Ht 65.0 in | Wt 254.0 lb

## 2021-10-06 DIAGNOSIS — M542 Cervicalgia: Secondary | ICD-10-CM

## 2021-10-06 DIAGNOSIS — G8929 Other chronic pain: Secondary | ICD-10-CM | POA: Diagnosis not present

## 2021-10-06 DIAGNOSIS — R079 Chest pain, unspecified: Secondary | ICD-10-CM | POA: Diagnosis not present

## 2021-10-06 DIAGNOSIS — M549 Dorsalgia, unspecified: Secondary | ICD-10-CM | POA: Diagnosis not present

## 2021-10-06 LAB — EKG 12-LEAD

## 2021-10-06 MED ORDER — PANTOPRAZOLE SODIUM 40 MG PO TBEC: 40.0000 mg | DELAYED_RELEASE_TABLET | Freq: Every day | ORAL | 3 refills | Status: DC

## 2021-10-06 MED ORDER — MELOXICAM 15 MG PO TABS: 15.0000 mg | ORAL_TABLET | Freq: Every day | ORAL | 0 refills | Status: DC

## 2021-10-06 NOTE — Patient Instructions (Addendum)
1. Intermittent chest pain ? ?- EKG 12-Lead ? ?EKG: normal EKG, normal sinus rhythm, PAC's noted. ? ? ?- meloxicam (MOBIC) 15 MG tablet; Take 1 tablet (15 mg total) by mouth daily.  Dispense: 30 tablet; Refill: 0 ?- pantoprazole (PROTONIX) 40 MG tablet; Take 1 tablet (40 mg total) by mouth daily.  Dispense: 30 tablet; Refill: 3 ? ?2. Chronic neck pain ? ?- Ambulatory referral to Physical Medicine Rehab ?- meloxicam (MOBIC) 15 MG tablet; Take 1 tablet (15 mg total) by mouth daily.  Dispense: 30 tablet; Refill: 0 ? ?3. Chronic bilateral back pain, unspecified back location ? ?- Ambulatory referral to Physical Medicine Rehab ?- meloxicam (MOBIC) 15 MG tablet; Take 1 tablet (15 mg total) by mouth daily.  Dispense: 30 tablet; Refill: 0 ? ?Follow up: ? ?Follow up: ? ?Follow up in 2-4 weeks with Dr. Andrey Campanile for chronic pain ? ?

## 2021-10-06 NOTE — Assessment & Plan Note (Signed)
-   EKG 12-Lead ?- meloxicam (MOBIC) 15 MG tablet; Take 1 tablet (15 mg total) by mouth daily.  Dispense: 30 tablet; Refill: 0 ?- pantoprazole (PROTONIX) 40 MG tablet; Take 1 tablet (40 mg total) by mouth daily.  Dispense: 30 tablet; Refill: 3 ? ?2. Chronic neck pain ? ?- Ambulatory referral to Physical Medicine Rehab ?- meloxicam (MOBIC) 15 MG tablet; Take 1 tablet (15 mg total) by mouth daily.  Dispense: 30 tablet; Refill: 0 ? ?3. Chronic bilateral back pain, unspecified back location ? ?- Ambulatory referral to Physical Medicine Rehab ?- meloxicam (MOBIC) 15 MG tablet; Take 1 tablet (15 mg total) by mouth daily.  Dispense: 30 tablet; Refill: 0 ? ?Follow up: ? ?Follow up: ?

## 2021-10-06 NOTE — Progress Notes (Signed)
@Patient  ID: Gloria Martin, female    DOB: 07/07/1961, 61 y.o.   MRN: [id number] ? ?Chief Complaint  ?Patient presents with  ? New Patient (Initial Visit)  ? Shoulder Pain  ? Neck Pain  ? Chest Pain  ?  Pain located in middle of chest   ? ? ?Referring provider: ?Maretta Beesrain, Whitney L, PA ? ? ?HPI ? ?Patient presents today to establish care.  She states that over the past few days she has been having intermittent chest pain.  She states that this is most likely due to reflux or developing bronchitis.  She states that she does get bronchitis frequently.  Patient states that she has had a long history of chronic back and neck pain.  We discussed that we can place a referral for her to PMR for further evaluation of her pain.  She has already been seen by a primary care physician and arthritis specialist for these issues.  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 Denies f/c/s, n/v/d, hemoptysis, PND, edema. ? ? ? ? ? ?No Known Allergies ? ? ?There is no immunization history on file for this patient. ? ?Past Medical History:  ?Diagnosis Date  ? Bone spur of other site   ? Spine  ? High cholesterol   ? Hypertension   ? Leg cramps   ? Obesity   ? ? ?Tobacco History: ?Social History  ? ?Tobacco Use  ?Smoking Status Former  ? Packs/day: 1.00  ? Types: Cigarettes  ? Start date: 681972  ? Quit date: 04/07/2020  ? Years since quitting: 1.4  ?Smokeless Tobacco Never  ? ?Counseling given: Not Answered ? ? ?Outpatient Encounter Medications as of 10/06/2021  ?Medication Sig  ? cilostazol (PLETAL) 50 MG tablet Take 1 tablet by mouth daily.  ? cyclobenzaprine (FLEXERIL) 10 MG tablet Take 1 tablet by mouth at bedtime as needed.  ? meloxicam (MOBIC) 15 MG tablet Take 1 tablet (15 mg total) by mouth daily.  ? pantoprazole (PROTONIX) 40 MG tablet Take 1 tablet (40 mg total) by mouth daily.  ? rosuvastatin (CRESTOR) 10 MG tablet rosuvastatin 10 mg tablet ? TAKE 1 TABLET BY MOUTH ONCE DAILY   ? SUMAtriptan (IMITREX) 50 MG tablet Take 1 tablet by mouth as needed.  ? valsartan (DIOVAN) 160 MG tablet Take 1 tablet by mouth daily.  ? [DISCONTINUED] meloxicam (MOBIC) 15 MG tablet Take 1 tablet by mouth as needed.  ? aspirin-acetaminophen-caffeine (EXCEDRIN MIGRAINE) 250-250-65 MG tablet Take 1 tablet by mouth every 6 (six) hours as needed for headache. (Patient not taking: Reported on 10/06/2021)  ? carisoprodol (SOMA) 350 MG tablet Take 350 mg by mouth 3 (three) times daily as needed. (Patient not taking: Reported on 10/06/2021)  ? cephALEXin (KEFLEX) 500 MG capsule Take 1 capsule (500 mg total) by mouth 3 (three) times daily. (Patient not taking: Reported on 10/06/2021)  ? losartan-hydrochlorothiazide (HYZAAR) 100-25 MG tablet Take 1 tablet by mouth daily. (Patient not taking: Reported on 03/30/2021)  ? oxyCODONE-acetaminophen (PERCOCET) 5-325 MG tablet Take 1-2 tablets by mouth every 6 (six) hours as needed. (Patient not taking: Reported on 10/06/2021)  ? ?No facility-administered encounter medications on file as of 10/06/2021.  ? ? ? ?Review of Systems ? ?Review of Systems  ?Constitutional: Negative.   ?HENT: Negative.    ?Cardiovascular:  Positive for chest pain.  ?Gastrointestinal: Negative.   ?Musculoskeletal:  Positive for arthralgias and myalgias.  ?Allergic/Immunologic: Negative.   ?Neurological: Negative.   ?  Psychiatric/Behavioral: Negative.     ? ? ? ?Physical Exam ? ?BP 138/81   Pulse (!) 121   Resp 16   Ht 5\' 5"  (1.651 m)   Wt 254 lb (115.2 kg)   SpO2 96%   BMI 42.27 kg/m?  ? ?Wt Readings from Last 5 Encounters:  ?10/06/21 254 lb (115.2 kg)  ?03/30/21 262 lb (118.8 kg)  ?02/03/20 250 lb (113.4 kg)  ?10/10/17 238 lb (108 kg)  ?08/09/17 238 lb (108 kg)  ? ? ? ?Physical Exam ?Vitals and nursing note reviewed.  ?Constitutional:   ?   General: She is not in acute distress. ?   Appearance: She is well-developed.  ?Cardiovascular:  ?   Rate and Rhythm: Regular rhythm. Tachycardia present.  ?Pulmonary:   ?   Effort: Pulmonary effort is normal.  ?   Breath sounds: Normal breath sounds.  ?Neurological:  ?   Mental Status: She is alert and oriented to person, place, and time.  ? ? ? ?Lab Results: ? ?CBC ?   ?Component Value Date/Time  ? WBC 15.4 (H) 07/09/2021 1946  ? RBC 5.03 07/09/2021 1946  ? HGB 15.3 (H) 07/09/2021 1946  ? HCT 46.2 (H) 07/09/2021 1946  ? PLT 244 07/09/2021 1946  ? MCV 91.8 07/09/2021 1946  ? MCV 93.9 10/30/2013 1813  ? MCH 30.4 07/09/2021 1946  ? MCHC 33.1 07/09/2021 1946  ? RDW 12.8 07/09/2021 1946  ? LYMPHSABS 0.9 07/09/2021 1946  ? MONOABS 0.5 07/09/2021 1946  ? EOSABS 0.0 07/09/2021 1946  ? BASOSABS 0.1 07/09/2021 1946  ? ? ?BMET ?   ?Component Value Date/Time  ? NA 136 07/09/2021 1946  ? K 4.9 07/09/2021 1946  ? CL 104 07/09/2021 1946  ? CO2 20 (L) 07/09/2021 1946  ? GLUCOSE 163 (H) 07/09/2021 1946  ? BUN 17 07/09/2021 1946  ? CREATININE 1.01 (H) 07/09/2021 1946  ? CREATININE 0.73 10/30/2013 1806  ? CALCIUM 9.3 07/09/2021 1946  ? GFRNONAA >60 07/09/2021 1946  ? GFRAA 46 (L) 04/07/2020 1045  ? ? ?BNP ?No results found for: BNP ? ?ProBNP ?No results found for: PROBNP ? ?Imaging: ?No results found. ? ? ?Assessment & Plan:  ? ?Intermittent chest pain ?- EKG 12-Lead ?- meloxicam (MOBIC) 15 MG tablet; Take 1 tablet (15 mg total) by mouth daily.  Dispense: 30 tablet; Refill: 0 ?- pantoprazole (PROTONIX) 40 MG tablet; Take 1 tablet (40 mg total) by mouth daily.  Dispense: 30 tablet; Refill: 3 ? ?2. Chronic neck pain ? ?- Ambulatory referral to Physical Medicine Rehab ?- meloxicam (MOBIC) 15 MG tablet; Take 1 tablet (15 mg total) by mouth daily.  Dispense: 30 tablet; Refill: 0 ? ?3. Chronic bilateral back pain, unspecified back location ? ?- Ambulatory referral to Physical Medicine Rehab ?- meloxicam (MOBIC) 15 MG tablet; Take 1 tablet (15 mg total) by mouth daily.  Dispense: 30 tablet; Refill: 0 ? ?Follow up: ? ?Follow up: with Dr. 04/09/2020 in 2-4 weeks ? ?Patient Instructions  ?1. Intermittent chest  pain ? ?- EKG 12-Lead ? ?EKG: normal EKG, normal sinus rhythm, PAC's noted. ? ? ?- meloxicam (MOBIC) 15 MG tablet; Take 1 tablet (15 mg total) by mouth daily.  Dispense: 30 tablet; Refill: 0 ?- pantoprazole (PROTONIX) 40 MG tablet; Take 1 tablet (40 mg total) by mouth daily.  Dispense: 30 tablet; Refill: 3 ? ?2. Chronic neck pain ? ?- Ambulatory referral to Physical Medicine Rehab ?- meloxicam (MOBIC) 15 MG tablet; Take 1 tablet (15 mg total)  by mouth daily.  Dispense: 30 tablet; Refill: 0 ? ?3. Chronic bilateral back pain, unspecified back location ? ?- Ambulatory referral to Physical Medicine Rehab ?- meloxicam (MOBIC) 15 MG tablet; Take 1 tablet (15 mg total) by mouth daily.  Dispense: 30 tablet; Refill: 0 ? ?Follow up: ? ?Follow up: ? ?Follow up in 2-4 weeks with Dr. Andrey Campanile for chronic pain ? ? ? ? ?Ivonne Andrew, NP ?10/06/2021 ? ?

## 2021-10-11 ENCOUNTER — Ambulatory Visit: Payer: Self-pay | Admitting: Family Medicine

## 2021-10-12 ENCOUNTER — Encounter: Payer: Self-pay | Admitting: Physical Medicine and Rehabilitation

## 2021-10-16 NOTE — Progress Notes (Signed)
? ? ?Patient ID: Gloria Martin, female    DOB: 1961/05/05  MRN: [id number] ? ?CC: Follow-Up ? ?Subjective: ?Gloria Martin is a 61 y.o. female who presents for follow-up.  ? ?Her concerns today include:  ? ?Intermittent chest pain follow-up: ?10/06/2021 with Angus Seller, NP: ?- EKG 12-Lead ?- meloxicam (MOBIC) 15 MG tablet; Take 1 tablet (15 mg total) by mouth daily.  Dispense: 30 tablet; Refill: 0 ?- pantoprazole (PROTONIX) 40 MG tablet; Take 1 tablet (40 mg total) by mouth daily.  Dispense: 30 tablet; Refill: 3 ? ?10/20/2021:  ?Still having intermittent chest pain with rest. Pantoprazole helping. Bilateral flank pain usually after meals and stomach pain lingering. Meloxicam helping. Family history of heart disease on paternal side. ? ?2. Chronic neck pain follow-up: ?3. Chronic bilateral back pain, unspecified back location follow-up: ?10/06/2021 with Angus Seller, NP: ?- Ambulatory referral to Physical Medicine Rehab ?- meloxicam (MOBIC) 15 MG tablet; Take 1 tablet (15 mg total) by mouth daily.  Dispense: 30 tablet; Refill: 0 ? ?10/20/2021: ?Meloxicam helping. Scheduled to see Physical Medicine in July 2023. Reports was told that she has a bone spur upper midline back. Has seen chiropractic for the same. Planning to get medical records released to our office for review.  ? ?4. Bilateral leg cramping: ?Persisting for years. Over the course of the years has seen Neurology and Vascular where testing and screening completed. Reports all testing and screening normal and no clear explanation given for the cause.  ? ?5. Rash: ?Persisting for years. Does not itch. Seen at [facility] where the plan was to do a biopsy. She declined related to concern for cleanness of the facility. Over the course of time seemed to be getting better but now worsening.  ? ?6. Hypertension: ?Doing well on current Valsartan 160 mg daily dose. No refills needed as of present. Checking blood pressure at home as needed. ? ?7.  Hyperlipidemia: ?Doing well on current Rosuvastatin. Requesting refills.  ? ?Patient Active Problem List  ? Diagnosis Date Noted  ? Intermittent chest pain 10/06/2021  ? Peripheral vascular disease (HCC) 08/17/2021  ? Prediabetes 08/11/2021  ? 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 Mixed hyperlipidemia 04/06/2020  ? Cramps of lower extremity 01/26/2018  ? Chronic low back pain 12/24/2017  ? Chest pain 08/09/2017  ? Overweight 08/09/2017  ? Essential hypertension 08/09/2017  ?  ? ?Current Outpatient Medications on File Prior to Visit  ?Medication Sig Dispense Refill  ? carisoprodol (SOMA) 350 MG tablet Take 350 mg by mouth 3 (three) times daily as needed. (Patient not taking: Reported on 10/06/2021)  0  ? cilostazol (PLETAL) 50 MG tablet Take 1 tablet by mouth daily.    ? cyclobenzaprine (FLEXERIL) 10 MG tablet Take 1 tablet by mouth at bedtime as needed.    ? pantoprazole (PROTONIX) 40 MG tablet Take 1 tablet (40 mg total) by mouth daily. 30 tablet 3  ? SUMAtriptan (IMITREX) 50 MG tablet Take 1 tablet by mouth as needed.    ? valsartan (DIOVAN) 160 MG tablet Take 1 tablet by mouth daily.    ? ?No current facility-administered medications on file prior to visit.  ? ? ?No Known Allergies ? ?Social History  ? ?Socioeconomic History  ? Marital status: Married  ?  Spouse name: Not on file  ? Number of children: Not on file  ? Years of education: Not on file  ? Highest education level: Not on file  ?Occupational History  ? Not on file  ?Tobacco Use  ?  Smoking status: Former  ?  Packs/day: 1.00  ?  Types: Cigarettes  ?  Start date: 26  ?  Quit date: 04/07/2020  ?  Years since quitting: 1.5  ?  Passive exposure: Never  ? Smokeless tobacco: Never  ?Vaping Use  ? Vaping Use: Never used  ?Substance and Sexual Activity  ? Alcohol use: No  ? Drug use: No  ? Sexual activity: Not on file  ?Other Topics Concern  ? Not on file  ?Social History Narrative  ? Not on file  ? ?Social Determinants of Health  ? ?Financial Resource Strain: Not on  file  ?Food Insecurity: Not on file  ?Transportation Needs: Not on file  ?Physical Activity: Not on file  ?Stress: Not on file  ?Social Connections: Not on file  ?Intimate Partner Violence: Not on file  ? ? ?Family History  ?Problem Relation Age of Onset  ? Hypertension Mother   ? High Cholesterol Mother   ? Hypertension Brother   ? ? ?Past Surgical History:  ?Procedure Laterality Date  ? TUBAL LIGATION    ? WISDOM TOOTH EXTRACTION Bilateral   ? ? ?ROS: ?Review of Systems ?Negative except as stated above ? ?PHYSICAL EXAM: ?BP 137/90 (BP Location: Right Arm, Patient Position: Sitting, Cuff Size: Large)   Pulse (!) 102   Temp 98.7 ?F (37.1 ?C)   Resp 18   Ht 5\' 5"  (1.651 m)   Wt 252 lb (114.3 kg)   SpO2 94%   BMI 41.93 kg/m?  ? ?Physical Exam ?HENT:  ?   Head: Normocephalic and atraumatic.  ?Eyes:  ?   Extraocular Movements: Extraocular movements intact.  ?   Conjunctiva/sclera: Conjunctivae normal.  ?   Pupils: Pupils are equal, round, and reactive to light.  ?Cardiovascular:  ?   Rate and Rhythm: Normal rate and regular rhythm.  ?   Pulses: Normal pulses.  ?   Heart sounds: Normal heart sounds.  ?Pulmonary:  ?   Effort: Pulmonary effort is normal.  ?   Breath sounds: Normal breath sounds.  ?Musculoskeletal:  ?   Cervical back: Normal range of motion and neck supple.  ?Skin: ?   General: Skin is warm and dry.  ?   Findings: Rash present.  ?   Comments: Macular erythematous rash generalized back.   ?Neurological:  ?   General: No focal deficit present.  ?   Mental Status: She is alert and oriented to person, place, and time.  ?Psychiatric:     ?   Mood and Affect: Mood normal.     ?   Behavior: Behavior normal.  ? ?ASSESSMENT AND PLAN: ?1. Intermittent chest pain: ?- Continue Meloxicam as prescribed.  ?- Continue Pantoprazole as prescribed. No refills needed as of present.  ?- Referral to Cardiology for further evaluation and management.  ?- meloxicam (MOBIC) 15 MG tablet; Take 1 tablet (15 mg total) by mouth  daily.  Dispense: 30 tablet; Refill: 2 ?- Ambulatory referral to Cardiology ? ?2. Colon cancer screening: ?3. Bilateral flank pain: ?- Referral to Gastroenterology for further evaluation and management.  ?- Ambulatory referral to Gastroenterology ? ?4. Chronic neck pain: ?5. Chronic bilateral back pain, unspecified back location: ?- Continue Meloxicam as prescribed.  ?- Referral to Orthopedic Surgery for further evaluation and management. ?- meloxicam (MOBIC) 15 MG tablet; Take 1 tablet (15 mg total) by mouth daily.  Dispense: 30 tablet; Refill: 2 ?- Ambulatory referral to Orthopedic Surgery ? ?6. Bilateral leg cramps: ?- Referral to  Orthopedic Surgery for further evaluation and management.  ?- Referral to Neurology for further evaluation and management.  ?- Ambulatory referral to Orthopedic Surgery ?- Ambulatory referral to Neurology ? ?7. Rash and nonspecific skin eruption: ?- Referral to Dermatology for further evaluation and management.  ?- Ambulatory referral to Dermatology ? ?8. Essential (primary) hypertension: ?- Continue Valsartan 160 mg daily as prescribed. No refills needed as of present.  ?- Counseled on blood pressure goal of less than 130/80, low-sodium, DASH diet, medication compliance, and 150 minutes of moderate intensity exercise per week as tolerated. Counseled on medication adherence and adverse effects. ?- Follow-up with primary provider as scheduled.  ? ?9. Mixed hyperlipidemia: ?- Continue Rosuvastatin as prescribed.  ?- Follow-up with primary provider as scheduled.  ?- rosuvastatin (CRESTOR) 10 MG tablet; Take 1 tablet (10 mg total) by mouth daily.  Dispense: 90 tablet; Refill: 1 ? ? ?Patient was given the opportunity to ask questions.  Patient verbalized understanding of the plan and was able to repeat key elements of the plan. Patient was given clear instructions to go to Emergency Department or return to medical center if symptoms don't improve, worsen, or new problems develop.The patient  verbalized understanding. ? ? ?Orders Placed This Encounter  ?Procedures  ? Ambulatory referral to Gastroenterology  ? Ambulatory referral to Cardiology  ? Ambulatory referral to Orthopedic Surgery  ? Ambulatory ref

## 2021-10-20 ENCOUNTER — Other Ambulatory Visit: Payer: Self-pay

## 2021-10-20 ENCOUNTER — Encounter: Payer: Self-pay | Admitting: Family

## 2021-10-20 ENCOUNTER — Ambulatory Visit (INDEPENDENT_AMBULATORY_CARE_PROVIDER_SITE_OTHER): Payer: 59 | Admitting: Family

## 2021-10-20 ENCOUNTER — Telehealth: Payer: Self-pay | Admitting: Family

## 2021-10-20 VITALS — BP 137/90 | HR 102 | Temp 98.7°F | Resp 18 | Ht 65.0 in | Wt 252.0 lb

## 2021-10-20 DIAGNOSIS — R079 Chest pain, unspecified: Secondary | ICD-10-CM

## 2021-10-20 DIAGNOSIS — R109 Unspecified abdominal pain: Secondary | ICD-10-CM

## 2021-10-20 DIAGNOSIS — M549 Dorsalgia, unspecified: Secondary | ICD-10-CM | POA: Diagnosis not present

## 2021-10-20 DIAGNOSIS — M542 Cervicalgia: Secondary | ICD-10-CM

## 2021-10-20 DIAGNOSIS — Z1211 Encounter for screening for malignant neoplasm of colon: Secondary | ICD-10-CM

## 2021-10-20 DIAGNOSIS — E782 Mixed hyperlipidemia: Secondary | ICD-10-CM

## 2021-10-20 DIAGNOSIS — I10 Essential (primary) hypertension: Secondary | ICD-10-CM

## 2021-10-20 DIAGNOSIS — R252 Cramp and spasm: Secondary | ICD-10-CM

## 2021-10-20 DIAGNOSIS — G8929 Other chronic pain: Secondary | ICD-10-CM

## 2021-10-20 DIAGNOSIS — R21 Rash and other nonspecific skin eruption: Secondary | ICD-10-CM

## 2021-10-20 MED ORDER — MELOXICAM 15 MG PO TABS: 15.0000 mg | ORAL_TABLET | Freq: Every day | ORAL | 2 refills | Status: DC

## 2021-10-20 MED ORDER — ROSUVASTATIN CALCIUM 10 MG PO TABS: 10.0000 mg | ORAL_TABLET | Freq: Every day | ORAL | 1 refills | Status: DC

## 2021-10-20 MED ORDER — MELOXICAM 15 MG PO TABS: 15.0000 mg | ORAL_TABLET | Freq: Every day | ORAL | 0 refills | Status: DC

## 2021-10-20 NOTE — Telephone Encounter (Signed)
Pt's insurance requires her to do a 90 day supply for her meds. ?The meloxicam (MOBIC) 15 MG tablet was sent as 30 w/ 2 refills. ?Can you please send in a 90 day supply for this medication to  ?[id number] - [place], [place] - [address] ?

## 2021-10-20 NOTE — Progress Notes (Signed)
Pt presents to get established w/primary care provider, seen Tanda Rockers, NP few weeks ago for chest pain states feel okay. ?Pt complains that after every meal she experiences flank pain on both sides  ? ? ?

## 2021-10-20 NOTE — Telephone Encounter (Signed)
Rx updated to 90 day supply

## 2021-10-25 ENCOUNTER — Encounter: Payer: Self-pay | Admitting: Nurse Practitioner

## 2021-10-25 ENCOUNTER — Ambulatory Visit: Payer: 59 | Admitting: Family

## 2021-11-04 ENCOUNTER — Encounter: Payer: Self-pay | Admitting: Orthopaedic Surgery

## 2021-11-04 ENCOUNTER — Ambulatory Visit (INDEPENDENT_AMBULATORY_CARE_PROVIDER_SITE_OTHER): Payer: 59 | Admitting: Orthopaedic Surgery

## 2021-11-04 VITALS — BP 117/81 | Ht 65.0 in | Wt 254.0 lb

## 2021-11-04 DIAGNOSIS — Z6841 Body Mass Index (BMI) 40.0 and over, adult: Secondary | ICD-10-CM | POA: Diagnosis not present

## 2021-11-04 DIAGNOSIS — G8929 Other chronic pain: Secondary | ICD-10-CM | POA: Diagnosis not present

## 2021-11-04 DIAGNOSIS — M545 Low back pain, unspecified: Secondary | ICD-10-CM | POA: Diagnosis not present

## 2021-11-04 NOTE — Progress Notes (Signed)
? ?Office Visit Note ?  ?Patient: Gloria Martin           ?Date of Birth: [birth year]           ?MRN: [id number] ?Visit Date: 11/04/2021 ?             ?Requested by: Rema Fendt, NP ?[id number] ?[address] ?[place],  [place] [address] ?PCP: Rema Fendt, NP ? ? ?Assessment & Plan: ?Visit Diagnoses:  ?1. Body mass index 40.0-44.9, adult (HCC)   ?2. Chronic low back pain without sciatica, unspecified back pain laterality   ? ? ?Plan: Would recommend physical therapy referral.  We discussed weight loss with a BMI 42 and benefits of weight loss with hypertension overall health and as well as decreasing her back pain symptoms.  Physical therapy for core strengthening home exercise program.  She will see a cardiology or GI for evaluation of her indigestion/chest pain symptoms. ? ?Follow-Up Instructions: No follow-ups on file.  ? ?Orders:  ?No orders of the defined types were placed in this encounter. ? ?No orders of the defined types were placed in this encounter. ? ? ? ? Procedures: ?No procedures performed ? ? ?Clinical Data: ?No additional findings. ? ? ?Subjective: ?Chief Complaint  ?Patient presents with  ? Middle Back - Pain  ? Lower Back - Pain  ? ? ?HPI 61 year old female states she has had back pain and neck pain for greater than 20 years.  She has not worked in more than 5 years states her husband still working and she states she is applying for disability.  She was told she had some bone spurs on x-ray she has been treated with meloxicam and also takes Flexeril.  She has pain in her neck that shoots into her back some pain in her arms at times.  She has had chest pain and sees a cardiologist next week also GI doctor at the end of the month.  She has been using a heating pad on her back and has dark discoloration of the skin splotchy where she has been up using the heating pad.  She states she was told by [facility] that she needs a full-thickness skin biopsy.  No rash anyplace else on her except for  the area of the heating pad.  She was diagnosed with livedo reticularis.  Patient has morbid obesity.  She states she cannot stand more than 5 minutes and then has to stop and sit down.  She gets relief with sitting. ? 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  Mild facet arthropathy noted.  No listhesis. ? ?Review of Systems all the systems are noncontributory to HPI. ? ? ?Objective: ?Vital Signs: BP 117/81   Ht 5\' 5"  (1.651 m)   Wt 254 lb (115.2 kg)   BMI 42.27 kg/m?  ? ?Physical Exam ?Constitutional:   ?   Appearance: She is well-developed.  ?HENT:  ?   Head: Normocephalic.  ?   Right Ear: External ear normal.  ?   Left Ear: External ear normal. There is no impacted cerumen.  ?Eyes:  ?   Pupils: Pupils are equal, round, and reactive to light.  ?Neck:  ?   Thyroid: No thyromegaly.  ?   Trachea: No tracheal deviation.  ?Cardiovascular:  ?   Rate and Rhythm: Normal rate.  ?Pulmonary:  ?   Effort: Pulmonary effort is normal.  ?Abdominal:  ?  Palpations: Abdomen is soft.  ?Musculoskeletal:  ?   Cervical back: No rigidity.  ?Skin: ?   General: Skin is warm and dry.  ?Neurological:  ?   Mental Status: She is alert and oriented to person, place, and time.  ?Psychiatric:     ?   Behavior: Behavior normal.  ? ? ?Ortho Exam patient is able to heel toe walk.  Discoloration over back area where heating pad been present none on her side stomach or over buttocks.  Anterior tib gastrocsoleus EHL is intact no quad atrophy. ? ?Specialty Comments:  ?No specialty comments available. ? ?Imaging: ?No results found. ? ? ?PMFS History: ?Patient Active Problem List  ? Diagnosis Date Noted  ? Intermittent chest pain 10/06/2021  ? Peripheral vascular disease (HCC) 08/17/2021  ? Prediabetes 08/11/2021  ? 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 Mixed hyperlipidemia 04/06/2020  ? Cramps of lower extremity 01/26/2018  ? Chronic low back pain  12/24/2017  ? Chest pain 08/09/2017  ? Overweight 08/09/2017  ? Essential hypertension 08/09/2017  ? ?Past Medical History:  ?Diagnosis Date  ? Bone spur of other site   ? Spine  ? High cholesterol   ? Hypertension   ? Leg cramps   ? Obesity   ?  ?Family History  ?Problem Relation Age of Onset  ? Hypertension Mother   ? High Cholesterol Mother   ? Hypertension Brother   ?  ?Past Surgical History:  ?Procedure Laterality Date  ? TUBAL LIGATION    ? WISDOM TOOTH EXTRACTION Bilateral   ? ?Social History  ? ?Occupational History  ? Not on file  ?Tobacco Use  ? Smoking status: Former  ?  Packs/day: 1.00  ?  Types: Cigarettes  ?  Start date: 66  ?  Quit date: 04/07/2020  ?  Years since quitting: 1.5  ?  Passive exposure: Never  ? Smokeless tobacco: Never  ?Vaping Use  ? Vaping Use: Never used  ?Substance and Sexual Activity  ? Alcohol use: No  ? Drug use: No  ? Sexual activity: Not on file  ? ? ? ? ? ? ?

## 2021-11-06 NOTE — Progress Notes (Signed)
?Cardiology Office Note:   ? ?Date:  11/09/2021  ? ?ID:  Penni Bombard, DOB 1961-04-25, MRN [id number] ? ?PCP:  Camillia Herter, NP  ?Cardiologist:  None  ? ?Referring MD: Camillia Herter, NP  ? ?No chief complaint on file. ? ? ?History of Present Illness:   ? ?Sydnee Ridinger is a 61 y.o. female with a hx of hyperlipidemia, hypertension, obesity, and intermittent CHEST PAIN. ? ?3 or more year history of lower sternal tightness.  Occurs spontaneously but can also occasionally occur with activity.  Episodes last less than 5 minutes.  Has been to the emergency room multiple times. ? ?Risk factors include prior smoking history for greater than 30 years, discontinued 2 years ago.  Positive family history of heart disease.  A slightly older brother has stents.  Snores loudly and may have a history of sleep apnea undiagnosed.  States that chest discomfort has been better since starting Protonix.  Had negative work-up with a nuclear perfusion study and echocardiogram in 2019. ? ?Past Medical History:  ?Diagnosis Date  ? Bone spur of other site   ? Spine  ? Chest pain 08/09/2017  ? Chronic low back pain 12/24/2017  ? Cramps of lower extremity 01/26/2018  ? Essential hypertension 08/09/2017  ? High cholesterol   ? Hypertension   ? Leg cramps   ? 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 Mixed hyperlipidemia 04/06/2020  ? Obesity   ? Overweight 08/09/2017  ? Peripheral vascular disease ([facility]) 08/17/2021  ? Prediabetes 08/11/2021  ? ? ?Past Surgical History:  ?Procedure Laterality Date  ? TUBAL LIGATION    ? WISDOM TOOTH EXTRACTION Bilateral   ? ? ?Current Medications: ?Current Meds  ?Medication Sig  ? carisoprodol (SOMA) 350 MG tablet Take 350 mg by mouth 3 (three) times daily as needed.  ? cilostazol (PLETAL) 50 MG tablet Take 1 tablet by mouth daily.  ? cyclobenzaprine (FLEXERIL) 10 MG tablet Take 1 tablet by mouth at bedtime as needed.  ? meloxicam (MOBIC) 15 MG tablet Take 1 tablet (15 mg total) by mouth daily.  ? pantoprazole (PROTONIX) 40 MG  tablet Take 1 tablet (40 mg total) by mouth daily.  ? rosuvastatin (CRESTOR) 10 MG tablet Take 1 tablet (10 mg total) by mouth daily.  ? SUMAtriptan (IMITREX) 50 MG tablet Take 1 tablet by mouth as needed.  ? valsartan (DIOVAN) 160 MG tablet Take 1 tablet by mouth daily.  ?  ? ?Allergies:   Patient has no known allergies.  ? ?Social History  ? ?Socioeconomic History  ? Marital status: Married  ?  Spouse name: Not on file  ? Number of children: Not on file  ? Years of education: Not on file  ? Highest education level: Not on file  ?Occupational History  ? Not on file  ?Tobacco Use  ? Smoking status: Former  ?  Packs/day: 1.00  ?  Types: Cigarettes  ?  Start date: 59  ?  Quit date: 04/07/2020  ?  Years since quitting: 1.5  ?  Passive exposure: Never  ? Smokeless tobacco: Never  ?Vaping Use  ? Vaping Use: Never used  ?Substance and Sexual Activity  ? Alcohol use: No  ? Drug use: No  ? Sexual activity: Not on file  ?Other Topics Concern  ? Not on file  ?Social History Narrative  ? Not on file  ? ?Social Determinants of Health  ? ?Financial Resource Strain: Not on file  ?Food Insecurity: Not on file  ?Transportation Needs:  Not on file  ?Physical Activity: Not on file  ?Stress: Not on file  ?Social Connections: Not on file  ?  ? ?Family History: ?The patient's family history includes High Cholesterol in her mother; Hypertension in her brother and mother. ? ?ROS:   ?Please see the history of present illness.    ?Concerned about her health.  Has musculoskeletal pain.  Does not want to be on medications.  Family complains that she snores loudly.  She does not sleep well.  Has chronic fatigue.  All other systems reviewed and are negative. ? ?EKGs/Labs/Other Studies Reviewed:   ? ?The following studies were reviewed today: ? ?Myocardial perfusion imaging 2019: ?Study Highlights ? ?  ?Nuclear stress EF: 66%. ?There was no ST segment deviation noted during stress. ?The study is normal. ?This is a low risk study. ?The left  ventricular ejection fraction is hyperdynamic (>65%). ?  ?Appears to be normalization artifact with low counts ?No ischemia or infarct on gray scale images ?EF normal 66% ?No ischemia on ECG with stress ? ?2D Doppler echocardiogram 10/04/2017: ?Study Conclusions  ? ?- Left ventricle: The cavity size was normal. Wall thickness was  ?  normal. Systolic function was normal. The estimated ejection  ?  fraction was in the range of 60% to 65%. Wall motion was normal;  ?  there were no regional wall motion abnormalities. Features are  ?  consistent with a pseudonormal left ventricular filling pattern,  ?  with concomitant abnormal relaxation and increased filling  ?  pressure (grade 2 diastolic dysfunction).  ? ?Lower extremity Doppler study 10/10/2020: ? ?Summary:  ?Right: Resting right ankle-brachial index is within normal range. No  ?evidence of significant right lower extremity arterial disease. The right  ?toe-brachial index is normal.  ? ?Left: Resting left ankle-brachial index is within normal range. No  ?evidence of significant left lower extremity arterial disease. The left  ?toe-brachial index is normal.  ? ?EKG:  EKG normal sinus rhythm, left axis deviation, PVCs, low voltage, otherwise unremarkable.  When compared with an EKG done on 10/06/2021, PVCs are new. ? ?Recent Labs: ?07/09/2021: ALT 38; BUN 17; Creatinine, Ser 1.01; Hemoglobin 15.3; Platelets 244; Potassium 4.9; Sodium 136  ?Recent Lipid Panel ?   ?Component Value Date/Time  ? CHOL 221 (H) 08/09/2017 1050  ? TRIG 136 08/09/2017 1050  ? HDL 62 08/09/2017 1050  ? CHOLHDL 3.6 08/09/2017 1050  ? LDLCALC 132 (H) 08/09/2017 1050  ? LDLDIRECT 123 (H) 10/30/2013 1806  ? ? ?Physical Exam:   ? ?VS:  BP (!) 148/98   Pulse (!) 102   Ht 5\' 5"  (1.651 m)   Wt 253 lb 3.2 oz (114.9 kg)   SpO2 97%   BMI 42.13 kg/m?    ? ?Wt Readings from Last 3 Encounters:  ?11/09/21 253 lb 3.2 oz (114.9 kg)  ?11/04/21 254 lb (115.2 kg)  ?10/20/21 252 lb (114.3 kg)  ?  ? ?GEN: Obese.  No acute distress ?HEENT: Normal ?NECK: No JVD. ?LYMPHATICS: No lymphadenopathy ?CARDIAC: No murmur. RRR no gallop, or edema. ?VASCULAR:  Normal Pulses. No bruits. ?RESPIRATORY:  Clear to auscultation without rales, wheezing or rhonchi  ?ABDOMEN: Soft, non-tender, non-distended, No pulsatile mass, ?MUSCULOSKELETAL: No deformity  ?SKIN: Warm and dry ?NEUROLOGIC:  Alert and oriented x 3 ?PSYCHIATRIC:  Normal affect  ? ?ASSESSMENT:   ? ?1. Intermittent chest pain   ?2. Claudication [facility])   ?3. Essential hypertension   ?4. Mixed hyperlipidemia   ?5. Snoring   ? ?  PLAN:   ? ?In order of problems listed above: ? ?Coronary calcium score.  We will use aspirin 81 mg/day if significant plaque burden.  May need coronary CTA with FFR if high risk study.  She did have a negative nuclear study but it was 4 years ago.  My overall impression at this time is that  ?No evidence of obstructive arterial disease.  The diagnosis of claudication is based list.  Discontinue Pletal. ?Blood pressure control is not good.  Could be related to weight andSleep apnea.  Target 130/80 mmHg.  Probably needs low-dose diuretic therapy added.  We will intensify therapy if significant coronary plaque burden ?Intensity lipid lowering will be dependent upon coronary calcium score. ?Strongly consider doing a sleep study. ? ?Overall education and awareness concerning primary/secondary risk prevention was discussed in detail: LDL less than 70, hemoglobin A1c less than 7, blood pressure target less than 130/80 mmHg, >150 minutes of moderate aerobic activity per week, avoidance of smoking, weight control (via diet and exercise), and continued surveillance/management of/for obstructive sleep apnea. ? ? ? ?Medication Adjustments/Labs and Tests Ordered: ?Current medicines are reviewed at length with the patient today.  Concerns regarding medicines are outlined above.  ?Orders Placed This Encounter  ?Procedures  ? CT CARDIAC SCORING (SELF PAY ONLY)  ? EKG 12-Lead   ? ?No orders of the defined types were placed in this encounter. ? ? ?Patient Instructions  ?Medication Instructions:  ?1) DISCONTINUE  Pletal ? ?*If you need a refill on your cardiac medications before your next a

## 2021-11-09 ENCOUNTER — Encounter: Payer: Self-pay | Admitting: Interventional Cardiology

## 2021-11-09 ENCOUNTER — Ambulatory Visit: Payer: 59 | Admitting: Interventional Cardiology

## 2021-11-09 VITALS — BP 148/98 | HR 102 | Ht 65.0 in | Wt 253.2 lb

## 2021-11-09 DIAGNOSIS — I739 Peripheral vascular disease, unspecified: Secondary | ICD-10-CM

## 2021-11-09 DIAGNOSIS — I10 Essential (primary) hypertension: Secondary | ICD-10-CM | POA: Diagnosis not present

## 2021-11-09 DIAGNOSIS — R079 Chest pain, unspecified: Secondary | ICD-10-CM | POA: Diagnosis not present

## 2021-11-09 DIAGNOSIS — R0683 Snoring: Secondary | ICD-10-CM

## 2021-11-09 DIAGNOSIS — E782 Mixed hyperlipidemia: Secondary | ICD-10-CM

## 2021-11-09 NOTE — Patient Instructions (Signed)
Medication Instructions:  ?1) DISCONTINUE  Pletal ? ?*If you need a refill on your cardiac medications before your next appointment, please call your pharmacy* ? ? ?Lab Work: ?None ?If you have labs (blood work) drawn today and your tests are completely normal, you will receive your results only by: ?MyChart Message (if you have MyChart) OR ?A paper copy in the mail ?If you have any lab test that is abnormal or we need to change your treatment, we will call you to review the results. ? ? ?Testing/Procedures: ?Your physician recommends that you have a Calcium Score performed. ? ? ?Follow-Up: ?At [facility], you and your health needs are our priority.  As part of our continuing mission to provide you with exceptional heart care, we have created designated Provider Care Teams.  These Care Teams include your primary Cardiologist (physician) and Advanced Practice Providers (APPs -  Physician Assistants and Nurse Practitioners) who all work together to provide you with the care you need, when you need it. ? ?We recommend signing up for the patient portal called "MyChart".  Sign up information is provided on this After Visit Summary.  MyChart is used to connect with patients for Virtual Visits (Telemedicine).  Patients are able to view lab/test results, encounter notes, upcoming appointments, etc.  Non-urgent messages can be sent to your provider as well.   ?To learn more about what you can do with MyChart, go to [email or web address].   ? ?Your next appointment:   ?As needed ? ?The format for your next appointment:   ?In Person ? ?Provider:   ?Darci Needle, III, MD  ? ? ?Other Instructions ? ? ?Important Information About Sugar ? ? ? ? ?  ?

## 2021-11-10 ENCOUNTER — Other Ambulatory Visit: Payer: Self-pay | Admitting: Nurse Practitioner

## 2021-11-10 DIAGNOSIS — R079 Chest pain, unspecified: Secondary | ICD-10-CM

## 2021-11-11 ENCOUNTER — Telehealth: Payer: Self-pay | Admitting: Family

## 2021-11-11 ENCOUNTER — Other Ambulatory Visit: Payer: Self-pay

## 2021-11-11 NOTE — Telephone Encounter (Signed)
Copied from [facility] [id number]. Topic: General - Other ?>> Nov 10, 2021 11:50 AM Antonieta Iba C wrote: ?Reason for CRM: pt called in with concern, pt says that medication carisoprodol (SOMA) 350 MG tablet is coming up on her medication list. Pt would like to know why this is coming up? ALSO, pt says that she still hasn't received a call from the neurologist that she was referred to to schedule an apt.  Pt would like to discuss his further. ? ? ?CB:  [phone] ?

## 2021-11-11 NOTE — Telephone Encounter (Signed)
Spoke to pt about Manuela Neptune being on medication list advised it was not prescribed by Minette Brine, NP that this was medication on her list from prev providers.Marland Kitchen adv pt about referral information, pt stated she will contact insurance company to see about academic center since Neurology declined referral ?

## 2021-11-13 NOTE — Progress Notes (Signed)
? ? ? ?11/13/2021 ?Gloria HurtMary Ruark ?[id number] ?01-30-1961 ? ? ?CHIEF COMPLAINT: Abdominal pain  ? ?HISTORY OF PRESENT ILLNESS: Gloria Martin is a 61 year old female with a past medical history of hypertension, hypercholesterolemia, peripheral vascular disease, chronic lower back pain, left adrenal adenoma per CT, kidney stone, diverticulosis ? ?She presents to our office today for  epigastric pain as referred by Ricky StabsAmy Stephens NP for further evaluation for bilateral flank pain and to schedule a screening colonoscopy.  She is currently undergoing evaluation by cardiologist Dr. Verdis PrimeHenry Smith due to having intermittent central chest pain which occurs every 2 months for the past 2 years. Her last episode of central chest pain occurred 2 to 3 weeks ago.  She describes her chest pain as a muscle pull which last for 2-3 consecutive days and is worse with exertion.  No associated nausea or vomiting. She has chronic shortness of breath since she had COVID 19 infection 03/2020.  She reported undergoing a stress test in 2019 which was negative.  She is scheduled for a coronary calcium CT 12/22/2021.  She denies having any chest pain or shortness of breath at this time. She was prescribed Pantoprazole 40 mg daily approximate 2 weeks ago by her PCP see if this treatment would reduce her chest pain episodes.  She denies having any heartburn or dysphagia.  She complains of having intermittent pain which radiates down the right and left sides of her abdomen since January 2023.  No severe abdominal pain.  No specific food triggers.  She is passing a normal brown formed bowel movement daily.  No rectal bleeding or black stools.  She has lost 10 pounds over the past 3 months.  She takes Meloxicam daily for the past 2 years due to having back pain.  She takes Flexeril for leg cramps.  No known family history of esophageal, gastric or colon cancer. ? ? ?  Latest Ref Rng & Units 07/09/2021  ?  7:46 PM 04/07/2020  ? 10:45 AM 02/03/2020  ?  8:26 PM   ?CBC  ?WBC 4.0 - 10.5 K/uL 15.4   5.4   11.2    ?Hemoglobin 12.0 - 15.0 g/dL 40.915.3   81.115.5   91.415.9    ?Hematocrit 36.0 - 46.0 % 46.2   45.7   47.2    ?Platelets 150 - 400 K/uL 244   212   292    ?  ? ?  Latest Ref Rng & Units 07/09/2021  ?  7:46 PM 04/07/2020  ? 10:45 AM 02/03/2020  ?  8:26 PM  ?CMP  ?Glucose 70 - 99 mg/dL 782163   956153   213137    ?BUN 6 - 20 mg/dL 17   12   24     ?Creatinine 0.44 - 1.00 mg/dL 0.861.01   5.781.46   4.691.22    ?Sodium 135 - 145 mmol/L 136   135   132    ?Potassium 3.5 - 5.1 mmol/L 4.9   3.9   3.4    ?Chloride 98 - 111 mmol/L 104   101   97    ?CO2 22 - 32 mmol/L 20   20   22     ?Calcium 8.9 - 10.3 mg/dL 9.3   9.0   9.2    ?Total Protein 6.5 - 8.1 g/dL 7.1   7.1     ?Total Bilirubin 0.3 - 1.2 mg/dL 2.1   0.6     ?Alkaline Phos 38 - 126 U/L 59  63     ?AST 15 - 41 U/L 50   68     ?ALT 0 - 44 U/L 38   55     ?  ? ?Past Medical History:  ?Diagnosis Date  ? Bone spur of other site   ? Spine  ? Chest pain 08/09/2017  ? Chronic low back pain 12/24/2017  ? Cramps of lower extremity 01/26/2018  ? Essential hypertension 08/09/2017  ? High cholesterol   ? Hypertension   ? Leg cramps   ? 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 Mixed hyperlipidemia 04/06/2020  ? Obesity   ? Overweight 08/09/2017  ? Peripheral vascular disease ([facility]) 08/17/2021  ? Prediabetes 08/11/2021  ? ?Past Surgical History:  ?Procedure Laterality Date  ? TUBAL LIGATION    ? WISDOM TOOTH EXTRACTION Bilateral   ? ?Social History: She is married.  She has 1 daughter.  She is unemployed.  She quit smoking cigarettes 2 years ago after she had Covid. No alcohol use. No drug use.  ? ?Family History: Mother with history of hypertension hypercholesterolemia.  No known family history of esophageal, gastric or colon cancer. ? ?No Known Allergies ?  ?Outpatient Encounter Medications as of 11/14/2021  ?Medication Sig  ? cilostazol (PLETAL) 50 MG tablet Take 1 tablet by mouth daily.  ? cyclobenzaprine (FLEXERIL) 10 MG tablet Take 1 tablet by mouth at bedtime as needed.  ?  meloxicam (MOBIC) 15 MG tablet Take 1 tablet (15 mg total) by mouth daily.  ? pantoprazole (PROTONIX) 40 MG tablet Take 1 tablet (40 mg total) by mouth daily.  ? rosuvastatin (CRESTOR) 10 MG tablet Take 1 tablet (10 mg total) by mouth daily.  ? SUMAtriptan (IMITREX) 50 MG tablet Take 1 tablet by mouth as needed.  ? valsartan (DIOVAN) 160 MG tablet Take 1 tablet by mouth daily.  ? valsartan (DIOVAN) 320 MG tablet Take 320 mg by mouth daily.  ? ?No facility-administered encounter medications on file as of 11/14/2021.  ? ? ?REVIEW OF SYSTEMS:  ?Gen: Denies fever, sweats or chills. No weight loss.  ?CV: Denies chest pain, palpitations or edema. ?Resp: See HPI.  No hemoptysis ?GI: See HPI.   ?GU : Denies urinary burning, blood in urine, increased urinary frequency or incontinence. ?MS: + Back pain, leg cramps.  ?Derm: Denies rash, itchiness, skin lesions or unhealing ulcers. ?Psych: Denies depression, anxiety or memory loss. ?Heme: Denies bruising, easy bleeding. ?Neuro:  + Headaches.  ?Endo:  Denies any problems with DM, thyroid or adrenal function. ? ?PHYSICAL EXAM: ?BP 130/70   Pulse 81   Ht 5' 4.5" (1.638 m) Comment: measured without shoes  Wt 255 lb 2 oz (115.7 kg)   BMI 43.12 kg/m?  ? ?General: 61 year old obese female in no acute distress ?Head: Normocephalic and atraumatic. ?Eyes:  Sclerae non-icteric, conjunctive pink. ?Ears: Normal auditory acuity. ?Mouth: Dentition intact. No ulcers or lesions.  ?Neck: Supple, no lymphadenopathy or thyromegaly.  ?Lungs: Clear bilaterally to auscultation without wheezes, crackles or rhonchi. ?Heart: Regular rate and rhythm. No murmur, rub or gallop appreciated.  ?Abdomen: Soft, nontender, non distended. No masses. No hepatosplenomegaly. Normoactive bowel sounds x 4 quadrants.  ?Rectal: Deferred. ?Musculoskeletal: Symmetrical with no gross deformities. ?Skin: Warm and dry. No rash or lesions on visible extremities. ?Extremities: No edema. ?Neurological: Alert oriented x 4, no  focal deficits.  ?Psychological:  Alert and cooperative. Normal mood and affect. ? ?ASSESSMENT AND PLAN: ? ?45) 61 year old female with midsternal chest pain every 2 months for the past  2 years, cardiac vs esophageal vs biliary etiology. ?-Patient to complet cardiac evaluation including a coronary CT scheduled 12/22/2021 ?-RUQ sonogram to rule out gallstones and to evaluate the biliary tree ?-Schedule EGD after cardiac clearance received. EGD benefits and risks discussed including risk with sedation, risk of bleeding, perforation and infection  ? ?2) Colon cancer screening  ?-A screening colonoscopy was recommended at the time of her EGD, however, the patient does not wish to pursue a conventional colonoscopy.  She will consider a Cologuard test, to discuss further with her PCP. ? ?3) Intermittent right and left sided abdominal pain, no current abdominal pain.  ?-Patient to contact office if her abdominal pains worsen ?-Recommend CTAP with contrast when having active abdominal pain ? ?4) Elevated AST level per labs 07/09/2022.  Suspect fatty liver.  ANA negative, Hepatitis B surface antigen negative, hepatitis B surface antibody nonreactive and hepatitis C antibody nonreactive 03/30/2021. ?-RUQ sono to assess for fatty liver ?-Repeat hepatic panel with PCP ? ? ? ? ? ? ?CC:  Rema Fendt, NP ? ? ? ?

## 2021-11-14 ENCOUNTER — Encounter: Payer: Self-pay | Admitting: Nurse Practitioner

## 2021-11-14 ENCOUNTER — Ambulatory Visit: Payer: 59 | Admitting: Nurse Practitioner

## 2021-11-14 VITALS — BP 130/70 | HR 81 | Ht 64.5 in | Wt 255.1 lb

## 2021-11-14 DIAGNOSIS — Z1211 Encounter for screening for malignant neoplasm of colon: Secondary | ICD-10-CM | POA: Diagnosis not present

## 2021-11-14 DIAGNOSIS — R079 Chest pain, unspecified: Secondary | ICD-10-CM | POA: Diagnosis not present

## 2021-11-14 NOTE — Patient Instructions (Addendum)
Please take your Pantoprazole 40 Mg once every day. ?Take Pepcid 20 MG at night. This is over the counter. ?We will work on obtaining full cardiac clearance for the EGD once they have completed your work up. ? ?You will be contacted by [facility] (Your caller ID will indicate phone # [phone]) in the next 7 days to schedule your Ultrasound to check for gallstones. If you have not heard from them within 7 business days, please call [facility] at [phone] to follow up on the status of your appointment.   ? ?Gastroesophageal Reflux Disease, Adult ? ?Gastroesophageal reflux (GER) happens when acid from the stomach flows up into the tube that connects the mouth and the stomach (esophagus). Normally, food travels down the esophagus and stays in the stomach to be digested. With GER, food and stomach acid sometimes move back up into the esophagus. ?You may have a disease called gastroesophageal reflux disease (GERD) if the reflux: ?Happens often. ?Causes frequent or very bad symptoms. ?Causes problems such as damage to the esophagus. ?When this happens, the esophagus becomes sore and swollen. Over time, GERD can make small holes (ulcers) in the lining of the esophagus. ?What are the causes? ?This condition is caused by a problem with the muscle between the esophagus and the stomach. When this muscle is weak or not normal, it does not close properly to keep food and acid from coming back up from the stomach. ?The muscle can be weak because of: ?Tobacco use. ?Pregnancy. ?Having a certain type of hernia (hiatal hernia). ?Alcohol use. ?Certain foods and drinks, such as coffee, chocolate, onions, and peppermint. ?What increases the risk? ?Being overweight. ?Having a disease that affects your connective tissue. ?Taking NSAIDs, such a ibuprofen. ?What are the signs or symptoms? ?Heartburn. ?Difficult or painful swallowing. ?The feeling of having a lump in the throat. ?A bitter  taste in the mouth. ?Bad breath. ?Having a lot of saliva. ?Having an upset or bloated stomach. ?Burping. ?Chest pain. Different conditions can cause chest pain. Make sure you see your doctor if you have chest pain. ?Shortness of breath or wheezing. ?A long-term cough or a cough at night. ?Wearing away of the surface of teeth (tooth enamel). ?Weight loss. ?How is this treated? ?Making changes to your diet. ?Taking medicine. ?Having surgery. ?Treatment will depend on how bad your symptoms are. ?Follow these instructions at home: ?Eating and drinking ? ?Follow a diet as told by your doctor. You may need to avoid foods and drinks such as: ?Coffee and tea, with or without caffeine. ?Drinks that contain alcohol. ?Energy drinks and sports drinks. ?Bubbly (carbonated) drinks or sodas. ?Chocolate and cocoa. ?Peppermint and mint flavorings. ?Garlic and onions. ?Horseradish. ?Spicy and acidic foods. These include peppers, chili powder, curry powder, vinegar, hot sauces, and BBQ sauce. ?Citrus fruit juices and citrus fruits, such as oranges, lemons, and limes. ?Tomato-based foods. These include red sauce, chili, salsa, and pizza with red sauce. ?Fried and fatty foods. These include donuts, french fries, potato chips, and high-fat dressings. ?High-fat meats. These include hot dogs, rib eye steak, sausage, ham, and bacon. ?High-fat dairy items, such as whole milk, butter, and cream cheese. ?Eat small meals often. Avoid eating large meals. ?Avoid drinking large amounts of liquid with your meals. ?Avoid eating meals during the 2-3 hours before bedtime. ?Avoid lying down right after you eat. ?Do not exercise right after you eat. ?Lifestyle ? ?Do not smoke or use any products that contain nicotine or  tobacco. If you need help quitting, ask your doctor. ?Try to lower your stress. If you need help doing this, ask your doctor. ?If you are overweight, lose an amount of weight that is healthy for you. Ask your doctor about a safe weight  loss goal. ?General instructions ?Pay attention to any changes in your symptoms. ?Take over-the-counter and prescription medicines only as told by your doctor. ?Do not take aspirin, ibuprofen, or other NSAIDs unless your doctor says it is okay. ?Wear loose clothes. Do not wear anything tight around your waist. ?Raise (elevate) the head of your bed about 6 inches (15 cm). You may need to use a wedge to do this. ?Avoid bending over if this makes your symptoms worse. ?Keep all follow-up visits. ?Contact a doctor if: ?You have new symptoms. ?You lose weight and you do not know why. ?You have trouble swallowing or it hurts to swallow. ?You have wheezing or a cough that keeps happening. ?You have a hoarse voice. ?Your symptoms do not get better with treatment. ?Get help right away if: ?You have sudden pain in your arms, neck, jaw, teeth, or back. ?You suddenly feel sweaty, dizzy, or light-headed. ?You have chest pain or shortness of breath. ?You vomit and the vomit is green, yellow, or black, or it looks like blood or coffee grounds. ?You faint. ?Your poop (stool) is red, bloody, or black. ?You cannot swallow, drink, or eat. ?These symptoms may represent a serious problem that is an emergency. Do not wait to see if the symptoms will go away. Get medical help right away. Call your local emergency services (911 in the [place] Do not drive yourself to the hospital. ?Summary ?If a person has gastroesophageal reflux disease (GERD), food and stomach acid move back up into the esophagus and cause symptoms or problems such as damage to the esophagus. ?Treatment will depend on how bad your symptoms are. ?Follow a diet as told by your doctor. ?Take all medicines only as told by your doctor. ?This information is not intended to replace advice given to you by your health care provider. Make sure you discuss any questions you have with your health care provider. ?Document Revised: 01/19/2020 Document Reviewed: 01/19/2020 ?Elsevier  Patient Education ? [employer]. ? ? ? ? ? ? ? ? ? ? ?

## 2021-11-16 ENCOUNTER — Telehealth: Payer: Self-pay

## 2021-11-16 NOTE — Progress Notes (Signed)
I agree with the assessment and plan as outlined by Ms. Kennedy-Smith. 

## 2021-11-16 NOTE — Telephone Encounter (Signed)
[facility] Pre-operative Risk Assessment  ?   ?Gloria Martin ?02/04/1961 ?[id number] ? ?Procedure: EGD ?Anesthesia type:  MAC ?Procedure Date: TBD ?Provider: Dr. Lorenso Courier ? ?Type of Clearance needed: we need full Cardiac clearance prior to scheduling EGD. ? ? ? ?Please review request and advise by either responding to this message or by sending your response to the fax # provided below. ? ?Thank you, ? ?[facility]  ?Phone: [phone] ?Fax: [phone] ?ATTENTION: Johnathin Vanderschaaf, CMA ? ?

## 2021-11-17 ENCOUNTER — Telehealth: Payer: Self-pay

## 2021-11-17 NOTE — Telephone Encounter (Signed)
? ?  Patient Name: Gloria Martin  ?DOB: 04/16/1961 ?MRN: [id number] ? ?Primary Cardiologist: None ? ?Chart reviewed as part of pre-operative protocol coverage. Patient recently seen on 11/09/2021 with intermittent chest pain with initial recommendation to proceed with calcium score (scheduled for 6/1), with further recommendations pending these results. We cannot provide further recommendations until her cardiac evaluation is complete.  ? ?Eula Listen, PA-C ?11/17/2021, 8:51 AM ?  ?

## 2021-11-17 NOTE — Telephone Encounter (Signed)
I will remove from the pre op call back pool at this time. We will need to wait until ct has been done and read before proceeding for pre op clearance.  ?

## 2021-11-17 NOTE — Telephone Encounter (Signed)
error 

## 2021-11-17 NOTE — Telephone Encounter (Signed)
I will send update to requesting office to see notes from pre op provider Eula Listen, PAC. Pt scheduled for CT 12/22/21 due to c/o chest pain.  ?

## 2021-11-24 ENCOUNTER — Ambulatory Visit (HOSPITAL_COMMUNITY)
Admission: RE | Admit: 2021-11-24 | Discharge: 2021-11-24 | Disposition: A | Discharge status: Home / Self Care | Payer: 59 | Source: Ambulatory Visit | Attending: Nurse Practitioner | Admitting: Nurse Practitioner

## 2021-11-24 DIAGNOSIS — R079 Chest pain, unspecified: Secondary | ICD-10-CM | POA: Insufficient documentation

## 2021-11-24 DIAGNOSIS — Z1211 Encounter for screening for malignant neoplasm of colon: Secondary | ICD-10-CM | POA: Diagnosis not present

## 2021-11-30 ENCOUNTER — Other Ambulatory Visit: Payer: Self-pay | Admitting: Family

## 2021-11-30 NOTE — Telephone Encounter (Signed)
Medication Refill - Medication:  ?valsartan (DIOVAN) 160 MG tablet  ? ?Has the patient contacted their pharmacy? Yes.   ?Contact PCP ? ?Preferred Pharmacy (with phone number or street name):  ?[id number] [place], [place] - [address]  ?[address] [place] [address]  ?Phone:  [phone]  Fax:  [phone]  ? ?Has the patient been seen for an appointment in the last year OR does the patient have an upcoming appointment? Yes.   ? ?Agent: Please be advised that RX refills may take up to 3 business days. We ask that you follow-up with your pharmacy. ?

## 2021-11-30 NOTE — Telephone Encounter (Signed)
Requested medication (s) are due for refill today: YES ? ?Requested medication (s) are on the active medication list: YES ? ?Last refill:   ? ?Future visit scheduled: NO ? ?Notes to clinic:  HISTORICAL MED. PLEASE ADVISE ? ? ?  ?Requested Prescriptions  ?Pending Prescriptions Disp Refills  ? valsartan (DIOVAN) 160 MG tablet    ?  Sig: Take 1 tablet (160 mg total) by mouth daily.  ?  ? Cardiovascular:  Angiotensin Receptor Blockers Failed - 11/30/2021  3:43 PM  ?  ?  Failed - Cr in normal range and within 180 days  ?  Creat  ?Date Value Ref Range Status  ?10/30/2013 0.73 0.50 - 1.10 mg/dL Final  ? ?Creatinine, Ser  ?Date Value Ref Range Status  ?07/09/2021 1.01 (H) 0.44 - 1.00 mg/dL Final  ?  ?  ?  ?  Passed - K in normal range and within 180 days  ?  Potassium  ?Date Value Ref Range Status  ?07/09/2021 4.9 3.5 - 5.1 mmol/L Final  ?  ?  ?  ?  Passed - Patient is not pregnant  ?  ?  Passed - Last BP in normal range  ?  BP Readings from Last 1 Encounters:  ?11/14/21 130/70  ?  ?  ?  ?  Passed - Valid encounter within last 6 months  ?  Recent Outpatient Visits   ? ?      ? 1 month ago Intermittent chest pain  ? Primary Care at [facility], Amy J, NP  ? 1 month ago Intermittent chest pain  ? Primary Care at [facility], Kriste Basque, NP  ? 7 years ago Intractable migraine without aura and without status migrainosus  ? Primary Care at [facility], Gay Filler, MD  ? 8 years ago Annual physical exam  ? Primary Care at [facility], Thao P, DO  ? 9 years ago Back pain  ? Primary Care at [facility], Thao P, DO  ? ?  ?  ?Future Appointments   ? ?        ? In 2 months Lovorn, Jinny Blossom, MD [facility] Physical Medicine and Rehabilitation, CPR  ? ?  ? ? ?  ?  ?  ? ?

## 2021-12-05 ENCOUNTER — Telehealth: Payer: Self-pay | Admitting: Family

## 2021-12-05 ENCOUNTER — Other Ambulatory Visit: Payer: Self-pay

## 2021-12-05 DIAGNOSIS — I10 Essential (primary) hypertension: Secondary | ICD-10-CM

## 2021-12-05 MED ORDER — VALSARTAN 160 MG PO TABS: 160.0000 mg | ORAL_TABLET | Freq: Every day | ORAL | 2 refills | Status: DC

## 2021-12-05 NOTE — Telephone Encounter (Signed)
[facility] [facility] ?

## 2021-12-05 NOTE — Telephone Encounter (Signed)
Patient checking on the status of 90 day supply for valsartan (DIOVAN) 160 MG tablet. Patient has 3 pills and would like a follow up call today.  ?  ?[id number] [place], [place]. Phone:  [phone]  ?Fax:  [phone]  ?  ? ?

## 2021-12-05 NOTE — Telephone Encounter (Signed)
Patient checking on the status of 90 day supply for valsartan (DIOVAN) 160 MG tablet. Patient has 3 pills and would like a follow up call today.  ?  ?[id number] [place], [place] - [address] Phone:  [phone]  ?Fax:  [phone]  ?  ? ?

## 2021-12-05 NOTE — Telephone Encounter (Signed)
Requested medication (s) are due for refill today:   Provider to review.  ? ?Requested medication (s) are on the active medication list:   On list as historical ? ?Future visit scheduled:   No    Seen a mo. ago ? ? ?Last ordered: Unknown ? ?Returned because last prescribed by a historical provider.   Pt only has 3 pills left on 12/05/2021.    ? ?Requested Prescriptions  ?Pending Prescriptions Disp Refills  ? valsartan (DIOVAN) 160 MG tablet    ?  Sig: Take 1 tablet (160 mg total) by mouth daily.  ?  ? Cardiovascular:  Angiotensin Receptor Blockers Failed - 12/05/2021  9:33 AM  ?  ?  Failed - Cr in normal range and within 180 days  ?  Creat  ?Date Value Ref Range Status  ?10/30/2013 0.73 0.50 - 1.10 mg/dL Final  ? ?Creatinine, Ser  ?Date Value Ref Range Status  ?07/09/2021 1.01 (H) 0.44 - 1.00 mg/dL Final  ?   ?  ?  Passed - K in normal range and within 180 days  ?  Potassium  ?Date Value Ref Range Status  ?07/09/2021 4.9 3.5 - 5.1 mmol/L Final  ?   ?  ?  Passed - Patient is not pregnant  ?  ?  Passed - Last BP in normal range  ?  BP Readings from Last 1 Encounters:  ?11/14/21 130/70  ?   ?  ?  Passed - Valid encounter within last 6 months  ?  Recent Outpatient Visits   ? ?      ? 1 month ago Intermittent chest pain  ? Primary Care at [facility], Amy J, NP  ? 2 months ago Intermittent chest pain  ? Primary Care at [facility], Gildardo Pounds, NP  ? 7 years ago Intractable migraine without aura and without status migrainosus  ? Primary Care at [facility], Gwenlyn Found, MD  ? 8 years ago Annual physical exam  ? Primary Care at [facility], Thao P, DO  ? 9 years ago Back pain  ? Primary Care at [facility], Thao P, DO  ? ?  ?  ?Future Appointments   ? ?        ? In 1 month Lovorn, Aundra Millet, MD [facility] Physical Medicine and Rehabilitation, CPR  ? ?  ? ? ?  ?  ?  ? ?

## 2021-12-17 ENCOUNTER — Other Ambulatory Visit: Payer: Self-pay | Admitting: Family

## 2021-12-17 DIAGNOSIS — I10 Essential (primary) hypertension: Secondary | ICD-10-CM

## 2021-12-20 NOTE — Telephone Encounter (Signed)
Refilled 12/05/2021 #30 2 refills. Requested Prescriptions  Pending Prescriptions Disp Refills  . valsartan (DIOVAN) 160 MG tablet [Pharmacy Med Name: VALSARTAN 160 MG TABLET] 90 tablet     Sig: TAKE 1 TABLET BY MOUTH EVERY DAY     Cardiovascular:  Angiotensin Receptor Blockers Failed - 12/17/2021 10:07 AM      Failed - Cr in normal range and within 180 days    Creat  Date Value Ref Range Status  10/30/2013 0.73 0.50 - 1.10 mg/dL Final   Creatinine, Ser  Date Value Ref Range Status  07/09/2021 1.01 (H) 0.44 - 1.00 mg/dL Final         Passed - K in normal range and within 180 days    Potassium  Date Value Ref Range Status  07/09/2021 4.9 3.5 - 5.1 mmol/L Final         Passed - Patient is not pregnant      Passed - Last BP in normal range    BP Readings from Last 1 Encounters:  11/14/21 130/70         Passed - Valid encounter within last 6 months    Recent Outpatient Visits          2 months ago Intermittent chest pain   Primary Care at [facility], Amy J, NP   2 months ago Intermittent chest pain   Primary Care at [facility], Gildardo Pounds, NP   7 years ago Intractable migraine without aura and without status migrainosus   Primary Care at [facility], Gwenlyn Found, MD   8 years ago Annual physical exam   Primary Care at [facility], Thao P, DO   9 years ago Back pain   [facility] Care at [facility], Rachelle Hora, DO      Future Appointments            In 1 month Lovorn, Aundra Millet, MD [facility] Physical Medicine and Rehabilitation, CPR

## 2021-12-22 ENCOUNTER — Telehealth: Payer: Self-pay

## 2021-12-22 ENCOUNTER — Ambulatory Visit
Admission: RE | Admit: 2021-12-22 | Discharge: 2021-12-22 | Disposition: A | Discharge status: Home / Self Care | Payer: Self-pay | Source: Ambulatory Visit | Attending: Interventional Cardiology | Admitting: Interventional Cardiology

## 2021-12-22 DIAGNOSIS — R079 Chest pain, unspecified: Secondary | ICD-10-CM

## 2021-12-22 NOTE — Telephone Encounter (Signed)
Will forward to pre op pool. CT was completed today, though does not seem to have been read by the cardiologist yet. I will forward this back to pre op to f/u on MD read of CT and for pre op clearance.

## 2021-12-22 NOTE — Telephone Encounter (Signed)
[facility] Pre-operative Risk Assessment     Gloria Martin 17-Jul-1961 [id number]   Procedure: EGD Anesthesia type:  MAC Procedure Date: TBD Provider: Dr. Lorenso Courier   Type of Clearance needed: we need full Cardiac clearance prior to scheduling EGD.      Please review request and advise by either responding to this message or by sending your response to the fax # provided below.   Thank you,   [facility]  Phone: [phone] Fax: [phone]

## 2021-12-22 NOTE — Telephone Encounter (Signed)
Patient underwent coronary calcium score today.  According to Dr. Michaelle Copas previous note, " May need coronary CTA with FFR if high risk study. "  Dr. Katrinka Blazing, do recommend additional evaluation prior to EGD?  Please forward your response to P CV [facility] PREOP

## 2021-12-26 ENCOUNTER — Telehealth: Payer: Self-pay

## 2021-12-26 DIAGNOSIS — R079 Chest pain, unspecified: Secondary | ICD-10-CM

## 2021-12-26 DIAGNOSIS — I10 Essential (primary) hypertension: Secondary | ICD-10-CM

## 2021-12-26 DIAGNOSIS — Z01812 Encounter for preprocedural laboratory examination: Secondary | ICD-10-CM

## 2021-12-26 NOTE — Telephone Encounter (Signed)
Left message for patient to call back for results.  

## 2021-12-26 NOTE — Telephone Encounter (Signed)
Left message for patient to call back  

## 2021-12-26 NOTE — Telephone Encounter (Signed)
-----   Message from Belva Crome, MD sent at 12/24/2021 12:07 PM EDT ----- Let the patient know she has coronary plaque buildup and needs Cor CTA with FFR to r/o blockage as cause of CP. If long wait, please do Lexi Myoview.One or the other needs to be done prior to EGD. A copy will be sent to Stacie Glaze, DO

## 2021-12-27 ENCOUNTER — Telehealth: Payer: Self-pay

## 2021-12-27 MED ORDER — METOPROLOL TARTRATE 100 MG PO TABS: 100.0000 mg | ORAL_TABLET | Freq: Once | ORAL | 0 refills | Status: DC

## 2021-12-27 NOTE — Telephone Encounter (Signed)
Spoke with

## 2021-12-27 NOTE — Addendum Note (Signed)
Addended by: Franchot Gallo on: 12/27/2021 09:40 AM   Modules accepted: Orders

## 2021-12-27 NOTE — Telephone Encounter (Signed)
Phone note created in error, see phone note from 12/26/21.

## 2021-12-27 NOTE — Telephone Encounter (Signed)
Spoke with patient and discussed results of coronary calcium score.  Per Dr. Katrinka Blazing: Let the patient know she has coronary plaque buildup and needs Cor CTA with FFR to r/o blockage as cause of CP. If long wait, please do Lexi Myoview.One or the other needs to be done prior to EGD.  Patient states she would prefer to have the Coronary CTA performed. Instructions reviewed with patient, will also send via MyChart for patient to look over.   Lopressor 100mg  x1 dose sent to pharmacy of choice. BMET ordered with lab appt scheduled for 01/03/22. CT scheduler to call patient and schedule appt for procedure. Patient verbalized understanding of the above.

## 2021-12-27 NOTE — Telephone Encounter (Signed)
-----   Message from Belva Crome, MD sent at 12/24/2021 12:07 PM EDT ----- Let the patient know she has coronary plaque buildup and needs Cor CTA with FFR to r/o blockage as cause of CP. If long wait, please do Lexi Myoview.One or the other needs to be done prior to EGD. A copy will be sent to Stacie Glaze, DO

## 2021-12-27 NOTE — Telephone Encounter (Signed)
Pt returning call and transferred to Demetrios Loll, RN

## 2021-12-28 NOTE — Telephone Encounter (Signed)
Coronary CTA scheduled for 01/17/22. BMET to be obtained on 01/03/22.

## 2022-01-03 ENCOUNTER — Other Ambulatory Visit: Payer: 59

## 2022-01-03 DIAGNOSIS — I10 Essential (primary) hypertension: Secondary | ICD-10-CM

## 2022-01-03 DIAGNOSIS — Z01812 Encounter for preprocedural laboratory examination: Secondary | ICD-10-CM

## 2022-01-04 LAB — BASIC METABOLIC PANEL
BUN/Creatinine Ratio: 13 (ref 12–28)
BUN: 12 mg/dL (ref 8–27)
CO2: 25 mmol/L (ref 20–29)
Calcium: 9.5 mg/dL (ref 8.7–10.3)
Chloride: 102 mmol/L (ref 96–106)
Creatinine, Ser: 0.94 mg/dL (ref 0.57–1.00)
Glucose: 123 mg/dL — ABNORMAL HIGH (ref 70–99)
Potassium: 4.4 mmol/L (ref 3.5–5.2)
Sodium: 143 mmol/L (ref 134–144)
eGFR: 69 mL/min/{1.73_m2} (ref >59)

## 2022-01-16 ENCOUNTER — Telehealth (HOSPITAL_COMMUNITY): Payer: Self-pay | Admitting: Emergency Medicine

## 2022-01-17 ENCOUNTER — Ambulatory Visit (HOSPITAL_COMMUNITY)
Admission: RE | Admit: 2022-01-17 | Discharge: 2022-01-17 | Disposition: A | Discharge status: Home / Self Care | Payer: 59 | Source: Ambulatory Visit | Attending: Interventional Cardiology | Admitting: Interventional Cardiology

## 2022-01-17 ENCOUNTER — Encounter (HOSPITAL_COMMUNITY): Payer: Self-pay

## 2022-01-17 DIAGNOSIS — R079 Chest pain, unspecified: Secondary | ICD-10-CM | POA: Diagnosis present

## 2022-01-17 MED ORDER — IOHEXOL 350 MG/ML SOLN
100.0000 mL | Freq: Once | INTRAVENOUS | Status: AC | PRN
  Administered 2022-01-17: 100 mL via INTRAVENOUS

## 2022-01-17 MED ORDER — NITROGLYCERIN 0.4 MG SL SUBL
SUBLINGUAL_TABLET | SUBLINGUAL | Status: AC
  Filled 2022-01-17: qty 2

## 2022-01-17 MED ORDER — NITROGLYCERIN 0.4 MG SL SUBL
0.8000 mg | SUBLINGUAL_TABLET | Freq: Once | SUBLINGUAL | Status: AC
  Administered 2022-01-17: 0.8 mg via SUBLINGUAL

## 2022-01-19 NOTE — Telephone Encounter (Signed)
Gloria Martin

## 2022-01-23 NOTE — Telephone Encounter (Addendum)
   Patient Name: Gloria Martin  DOB: 07/28/1960 MRN: [id number]  Primary Cardiologist: Lesleigh Noe, MD  Chart reviewed as part of pre-operative protocol coverage.  Patient was awaiting coronary CTA results for surgical clearance.  Per Dr. Katrinka Blazing, primary cardiologist, the patient has "very mild plaque development. No significant obstruction.  Continue statin therapy and baby aspirin. Get plenty exercise.  No action required at this time." Therefore, given past medical history and time since last visit, based on ACC/AHA guidelines, [name] would be at acceptable risk for the planned procedure without further cardiovascular testing.   I will route this recommendation to the requesting party via Epic fax function and remove from pre-op pool.  Please call with questions.  Joylene Grapes, NP 01/23/2022, 11:21 AM

## 2022-01-25 ENCOUNTER — Other Ambulatory Visit: Payer: Self-pay

## 2022-01-25 ENCOUNTER — Telehealth: Payer: Self-pay | Admitting: Family

## 2022-01-25 DIAGNOSIS — Z1211 Encounter for screening for malignant neoplasm of colon: Secondary | ICD-10-CM

## 2022-01-25 NOTE — Telephone Encounter (Signed)
Pt called in to request the cologuard. Pt says that she was suggested to have a colonoscopy. Pt says that she doesn't want to have one and would rather have colo guard.   Pt would like further assistance.  [phone]

## 2022-01-25 NOTE — Telephone Encounter (Signed)
Just FYI Pt has an  appointment tomorrow

## 2022-01-25 NOTE — Progress Notes (Signed)
Order placed for Cologuard per pt request

## 2022-01-25 NOTE — Telephone Encounter (Signed)
Order for Cologuard placed. 

## 2022-01-26 ENCOUNTER — Ambulatory Visit (AMBULATORY_SURGERY_CENTER): Payer: 59 | Admitting: *Deleted

## 2022-01-26 VITALS — Ht 64.0 in | Wt 254.0 lb

## 2022-01-26 DIAGNOSIS — R1013 Epigastric pain: Secondary | ICD-10-CM

## 2022-01-26 NOTE — Progress Notes (Signed)
No egg or soy allergy known to patient  No issues known to pt with past sedation with any surgeries or procedures Patient denies ever being told they had issues or difficulty with intubation  No FH of Malignant Hyperthermia Pt is not on diet pills Pt is not on  home 02  Pt is not on blood thinners   No A fib or A flutter Instructions explained over the phone and sent to MyChart, questions answered.

## 2022-01-29 ENCOUNTER — Other Ambulatory Visit: Payer: Self-pay | Admitting: Family

## 2022-01-29 DIAGNOSIS — E782 Mixed hyperlipidemia: Secondary | ICD-10-CM

## 2022-01-29 DIAGNOSIS — I10 Essential (primary) hypertension: Secondary | ICD-10-CM

## 2022-01-29 DIAGNOSIS — R079 Chest pain, unspecified: Secondary | ICD-10-CM

## 2022-01-29 DIAGNOSIS — G8929 Other chronic pain: Secondary | ICD-10-CM

## 2022-01-30 ENCOUNTER — Encounter: Payer: Self-pay | Admitting: Physical Medicine and Rehabilitation

## 2022-01-30 ENCOUNTER — Encounter: Payer: 59 | Attending: Physical Medicine and Rehabilitation | Admitting: Physical Medicine and Rehabilitation

## 2022-01-30 VITALS — BP 136/82 | HR 91 | Ht 64.0 in | Wt 253.0 lb

## 2022-01-30 DIAGNOSIS — M5442 Lumbago with sciatica, left side: Secondary | ICD-10-CM | POA: Insufficient documentation

## 2022-01-30 DIAGNOSIS — R635 Abnormal weight gain: Secondary | ICD-10-CM | POA: Diagnosis not present

## 2022-01-30 DIAGNOSIS — G8929 Other chronic pain: Secondary | ICD-10-CM | POA: Insufficient documentation

## 2022-01-30 DIAGNOSIS — M7918 Myalgia, other site: Secondary | ICD-10-CM | POA: Insufficient documentation

## 2022-01-30 DIAGNOSIS — M5441 Lumbago with sciatica, right side: Secondary | ICD-10-CM | POA: Diagnosis not present

## 2022-01-30 NOTE — Telephone Encounter (Signed)
Requested Prescriptions  Pending Prescriptions Disp Refills  . rosuvastatin (CRESTOR) 10 MG tablet [Pharmacy Med Name: ROSUVASTATIN CALCIUM 10 MG TAB] 90 tablet 1    Sig: TAKE 1 TABLET BY MOUTH EVERY DAY     Cardiovascular:  Antilipid - Statins 2 Failed - 01/29/2022  3:58 PM      Failed - Lipid Panel in normal range within the last 12 months    Cholesterol, Total  Date Value Ref Range Status  08/09/2017 221 (H) 100 - 199 mg/dL Final   LDL Calculated  Date Value Ref Range Status  08/09/2017 132 (H) 0 - 99 mg/dL Final   Direct LDL  Date Value Ref Range Status  10/30/2013 123 (H) mg/dL Final    Comment:    ATP III Classification (LDL):       < 100        mg/dL         Optimal      100 - 129     mg/dL         Near or Above Optimal      130 - 159     mg/dL         Borderline High      160 - 189     mg/dL         High       > 190        mg/dL         Very High     HDL  Date Value Ref Range Status  08/09/2017 62 >39 mg/dL Final   Triglycerides  Date Value Ref Range Status  08/09/2017 136 0 - 149 mg/dL Final         Passed - Cr in normal range and within 360 days    Creat  Date Value Ref Range Status  10/30/2013 0.73 0.50 - 1.10 mg/dL Final   Creatinine, Ser  Date Value Ref Range Status  01/03/2022 0.94 0.57 - 1.00 mg/dL Final         Passed - Patient is not pregnant      Passed - Valid encounter within last 12 months    Recent Outpatient Visits          3 months ago Intermittent chest pain   Primary Care at [facility], Amy J, NP   3 months ago Intermittent chest pain   Primary Care at [facility], Kriste Basque, NP   7 years ago Intractable migraine without aura and without status migrainosus   Primary Care at [facility], Gay Filler, MD   8 years ago Annual physical exam   Primary Care at [facility], Thao P, DO   9 years ago Back pain   Primary Care at [facility], Thao P, DO             . meloxicam (MOBIC) 15 MG tablet [Pharmacy Med Name:  MELOXICAM 15 MG TABLET] 90 tablet 0    Sig: TAKE 1 TABLET (15 MG TOTAL) BY MOUTH DAILY.     Analgesics:  COX2 Inhibitors Failed - 01/29/2022  3:58 PM      Failed - Manual Review: Labs are only required if the patient has taken medication for more than 8 weeks.      Failed - HGB in normal range and within 360 days    Hemoglobin  Date Value Ref Range Status  07/09/2021 15.3 (H) 12.0 - 15.0 g/dL Final  Failed - HCT in normal range and within 360 days    HCT  Date Value Ref Range Status  07/09/2021 46.2 (H) 36.0 - 46.0 % Final         Failed - AST in normal range and within 360 days    AST  Date Value Ref Range Status  07/09/2021 50 (H) 15 - 41 U/L Final         Passed - Cr in normal range and within 360 days    Creat  Date Value Ref Range Status  10/30/2013 0.73 0.50 - 1.10 mg/dL Final   Creatinine, Ser  Date Value Ref Range Status  01/03/2022 0.94 0.57 - 1.00 mg/dL Final         Passed - ALT in normal range and within 360 days    ALT  Date Value Ref Range Status  07/09/2021 38 0 - 44 U/L Final         Passed - eGFR is 30 or above and within 360 days    GFR calc Af Amer  Date Value Ref Range Status  04/07/2020 46 (L) >60 mL/min Final   GFR, Estimated  Date Value Ref Range Status  07/09/2021 >60 >60 mL/min Final    Comment:    (NOTE) Calculated using the CKD-EPI Creatinine Equation (2021)    eGFR  Date Value Ref Range Status  01/03/2022 69 >59 mL/min/1.73 Final         Passed - Patient is not pregnant      Passed - Valid encounter within last 12 months    Recent Outpatient Visits          3 months ago Intermittent chest pain   Primary Care at [facility], Amy J, NP   3 months ago Intermittent chest pain   Primary Care at [facility], Kriste Basque, NP   7 years ago Intractable migraine without aura and without status migrainosus   Primary Care at [facility], Gay Filler, MD   8 years ago Annual physical exam   Primary Care at  [facility], Thao P, DO   9 years ago Back pain   Primary Care at [facility], Thao P, DO             . valsartan (DIOVAN) 160 MG tablet [Pharmacy Med Name: VALSARTAN 160 MG TABLET] 90 tablet 0    Sig: TAKE 1 TABLET BY MOUTH EVERY DAY     Cardiovascular:  Angiotensin Receptor Blockers Failed - 01/29/2022  3:58 PM      Failed - Last BP in normal range    BP Readings from Last 1 Encounters:  01/30/22 136/82         Passed - Cr in normal range and within 180 days    Creat  Date Value Ref Range Status  10/30/2013 0.73 0.50 - 1.10 mg/dL Final   Creatinine, Ser  Date Value Ref Range Status  01/03/2022 0.94 0.57 - 1.00 mg/dL Final         Passed - K in normal range and within 180 days    Potassium  Date Value Ref Range Status  01/03/2022 4.4 3.5 - 5.2 mmol/L Final         Passed - Patient is not pregnant      Passed - Valid encounter within last 6 months    Recent Outpatient Visits          3 months ago Intermittent chest pain   Primary Care at  [facility], Amy J, NP   3 months ago Intermittent chest pain   Primary Care at [facility], Kriste Basque, NP   7 years ago Intractable migraine without aura and without status migrainosus   Primary Care at [facility], Gay Filler, MD   8 years ago Annual physical exam   Primary Care at [facility], Thao P, DO   9 years ago Back pain   Primary Care at [place], [place], DO

## 2022-01-30 NOTE — Patient Instructions (Addendum)
Pt is a 61 yr old female with hx of morbid obesity- BMI of 43.43 up from 42 4 months ago.  With chronic neck and back pain and leg pain.   Looking for a small treadmill- so can walk- goal to start 20 minutes/day and build to 50-60 minutes/day.   2. Needs to do home exercise program/walking/back exercises at least 5 days/week.    3. PT referral at [facility]- will place referral. Needs to do therapy HEP/home exercise plan 5+ days/week.    4. Weight loss 50% exercise and 50% nutrition.    5. Try magnesium- 400 mg 1-3x/day for muscle spasms.  Has tried 500 mg daily in past- for 1 week- caused nausea.  Can try a lower dose- can try as low as 100 mg 2-3x/day- try after cologuard.   6. Can continue Flexeril. 10 mg nightly as needed for muscle spasms.   7. Get exercise ball- sit on 30 minutes-60 minutes/day to help build up core strength. Knees at 90 degrees    8. Theracane-  Hold pressure, not massage 2-4 minutes with this every 1-2 days. To relax the muscles.   9.  F/U in 3 months- to see how progressing. Yoga will help pain.   10.  TSH -last one was 8 years ago. Get that done today.  Doesn't get cold easily- losing hair; loss of lateral eyebrows.

## 2022-01-30 NOTE — Progress Notes (Signed)
Subjective:    Patient ID: Gloria Martin, female    DOB: May 25, 1961, 61 y.o.   MRN: [id number]  HPI Pt is a 61 yr old female with hx of morbid obesity- BMI of 43.43 up from 42 4 months ago.  With chronic neck and back pain and leg pain.   Here for evaluation for chronic pain.   Has back pain "every day of life".  Right in the center of back.  ~ T8-9.  Muscles in back "so tight" esp in neck and mid/low back.   Feels like being beaten in middle of back.   Takes flexeril QHS for spasms.    In 2019- legs started spasms as soon as got out of a pool- was given a lot of water.  Sharp pain- mainly in anterior thighs. Feels like toes numb but intermittent.   Hasn't done any stretches/exercising.  Was told to exercise by Cardiology .  Ortho recommended to go to therapy.  Dr Ophelia Charter- but still has to call them to let them know needs PT for low back pain quit soda; drinks water.  Has veggies and no fried food  Doesn't live in a good area of town- wants     Pain Inventory Average Pain 4 Pain Right Now 5 My pain is  "feels like someone is punch in the back"  In the last 24 hours, has pain interfered with the following? General activity 6 Relation with others 4 Enjoyment of life 7 What TIME of day is your pain at its worst? morning  and evening Sleep (in general) Poor  Pain is worse with: walking, bending, sitting, standing, and some activites Pain improves with: rest, heat/ice, medication, and TENS Relief from Meds: 5  walk without assistance how many minutes can you walk? 10 ability to climb steps?  yes do you drive?  yes Do you have any goals in this area?  no  not employed: date last employed 05/2018 Do you have any goals in this area?  no  weakness numbness tingling spasms dizziness  Any changes since last visit?  no  Any changes since last visit?  no    Family History  Problem Relation Age of Onset   Hypertension Mother    High Cholesterol Mother     Heart disease Father    Hypertension Brother    Coronary artery disease Brother    Diabetes Maternal Aunt    Esophageal cancer Maternal Uncle    Heart disease Paternal Aunt    Diabetes Maternal Grandmother    Colon cancer Neg Hx    Colon polyps Neg Hx    Stomach cancer Neg Hx    Rectal cancer Neg Hx    Social History   Socioeconomic History   Marital status: Married    Spouse name: Not on file   Number of children: 1   Years of education: Not on file   Highest education level: Not on file  Occupational History   Not on file  Tobacco Use   Smoking status: Former    Packs/day: 1.00    Types: Cigarettes    Start date: 83    Quit date: 04/07/2020    Years since quitting: 1.8    Passive exposure: Never   Smokeless tobacco: Never  Vaping Use   Vaping Use: Never used  Substance and Sexual Activity   Alcohol use: No   Drug use: No   Sexual activity: Not on file  Other Topics Concern   Not on  file  Social History Narrative   Not on file   Social Determinants of Health   Financial Resource Strain: Not on file  Food Insecurity: Not on file  Transportation Needs: Not on file  Physical Activity: Not on file  Stress: Not on file  Social Connections: Not on file   Past Surgical History:  Procedure Laterality Date   TUBAL LIGATION     WISDOM TOOTH EXTRACTION Bilateral    Past Medical History:  Diagnosis Date   Bone spur of other site    Spine   Chest pain 08/09/2017   Chronic low back pain 12/24/2017   Cramps of lower extremity 01/26/2018   Essential hypertension 08/09/2017   GERD (gastroesophageal reflux disease)    High cholesterol    Hypertension    Kidney stones    Leg cramps    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   Mixed hyperlipidemia 04/06/2020   Obesity    Overweight 08/09/2017   Peripheral vascular disease ([place]) 08/17/2021   Prediabetes 08/11/2021   BP (!) 149/106   Pulse 91   Ht 5\' 4"  (1.626 m)   Wt 253 lb (114.8 kg)   SpO2 95%   BMI 43.43 kg/m    Opioid Risk Score:   Fall Risk Score:  `1  Depression screen [facility] 2/9     01/30/2022    2:24 PM 10/20/2021    8:50 AM  Depression screen PHQ 2/9  Decreased Interest 2 0  Down, Depressed, Hopeless 0 1  PHQ - 2 Score 2 1  Altered sleeping 1   Tired, decreased energy 1   Change in appetite 1   Feeling bad or failure about yourself  0   Trouble concentrating 0   Moving slowly or fidgety/restless 0   Suicidal thoughts 0   PHQ-9 Score 5   Difficult doing work/chores Somewhat difficult      Review of Systems  Constitutional:  Positive for appetite change and unexpected weight change.  HENT: Negative.    Eyes: Negative.   Respiratory:  Positive for shortness of breath.   Cardiovascular:  Positive for chest pain.  Gastrointestinal:  Positive for abdominal pain and nausea.  Endocrine: Negative.   Genitourinary: Negative.   Musculoskeletal:  Positive for back pain.  Skin:  Positive for rash.  Allergic/Immunologic: Negative.   Neurological:  Positive for dizziness, weakness and numbness.  Hematological: Negative.   Psychiatric/Behavioral:  Positive for sleep disturbance.       Objective:   Physical Exam Awake, alert, appropriate, sitting on table, NAD  Myofascial trigger points in upper traps, levators, scalenes,  As well as cervical paraspinals as well as thoracic and lumbar paraspinals. Also in anterior tibialis.    MS: 5/5 in LE's HF, KE, KF, DF and PF B/L   Skin: reticular pattern on back/middle and low back.        Assessment & Plan:   Pt is a 61 yr old female with hx of morbid obesity- BMI of 43.43 up from 42 4 months ago.  With chronic neck and back pain and leg pain.   Looking for a small treadmill- so can walk- goal to start 20 minutes/day and build to 50-60 minutes/day.   2. Needs to do home exercise program/walking/back exercises at least 5 days/week.    3. PT referral at [facility]- will place referral. Needs to do therapy HEP/home exercise plan 5+  days/week.    4. Weight loss 50% exercise and 50% nutrition.    5. Try magnesium- 400 mg  1-3x/day for muscle spasms.  Has tried 500 mg daily in past- for 1 week- caused nausea.  Can try a lower dose- can try as low as 100 mg 2-3x/day- try after cologuard.   6. Can continue Flexeril. 10 mg nightly as needed for muscle spasms.   7. Get exercise ball- sit on 30 minutes-60 minutes/day to help build up core strength. Knees at 90 degrees    8. Theracane-  Hold pressure, not massage 2-4 minutes with this every 1-2 days. To relax the muscles. Can also use with tennis balls.   9.  F/U in 3 months- to see how progressing.  Can also do yoga-helps pain.   10.  TSH -last one was 8 years ago. Get that done today.  Doesn't get cold easily- losing hair; loss of lateral eyebrows.   11. Wait on nerve pain meds- esp since many can cause weight gain.   I spent a total of 49   minutes on total care today- >50% coordination of care- due to education on exercises, weight loss and myofascial release

## 2022-01-31 LAB — TSH: TSH: 1.36 u[IU]/mL (ref 0.450–4.500)

## 2022-02-05 ENCOUNTER — Encounter: Payer: Self-pay | Admitting: Certified Registered Nurse Anesthetist

## 2022-02-07 ENCOUNTER — Encounter: Payer: Self-pay | Admitting: Internal Medicine

## 2022-02-09 ENCOUNTER — Ambulatory Visit (AMBULATORY_SURGERY_CENTER): Payer: 59 | Admitting: Internal Medicine

## 2022-02-09 ENCOUNTER — Encounter: Payer: Self-pay | Admitting: Internal Medicine

## 2022-02-09 VITALS — BP 126/84 | HR 90 | Temp 97.3°F | Resp 16 | Ht 64.5 in | Wt 254.0 lb

## 2022-02-09 DIAGNOSIS — R0789 Other chest pain: Secondary | ICD-10-CM | POA: Diagnosis not present

## 2022-02-09 DIAGNOSIS — K259 Gastric ulcer, unspecified as acute or chronic, without hemorrhage or perforation: Secondary | ICD-10-CM

## 2022-02-09 DIAGNOSIS — K2970 Gastritis, unspecified, without bleeding: Secondary | ICD-10-CM

## 2022-02-09 DIAGNOSIS — K319 Disease of stomach and duodenum, unspecified: Secondary | ICD-10-CM | POA: Diagnosis not present

## 2022-02-09 DIAGNOSIS — R1013 Epigastric pain: Secondary | ICD-10-CM | POA: Diagnosis present

## 2022-02-09 DIAGNOSIS — K229 Disease of esophagus, unspecified: Secondary | ICD-10-CM

## 2022-02-09 MED ORDER — OMEPRAZOLE 40 MG PO CPDR: 40.0000 mg | DELAYED_RELEASE_CAPSULE | Freq: Two times a day (BID) | ORAL | 1 refills | Status: DC

## 2022-02-09 MED ORDER — SODIUM CHLORIDE 0.9 % IV SOLN: 500.0000 mL | Freq: Once | INTRAVENOUS | Status: DC

## 2022-02-09 NOTE — Progress Notes (Signed)
GASTROENTEROLOGY PROCEDURE H&P NOTE   Primary Care Physician: Rema Fendt, NP    Reason for Procedure:   Non-cardiac chest pain, abdominal pain  Plan:    EGD  Patient is appropriate for endoscopic procedure(s) in the ambulatory ([facility]) setting.  The nature of the procedure, as well as the risks, benefits, and alternatives were carefully and thoroughly reviewed with the patient. Ample time for discussion and questions allowed. The patient understood, was satisfied, and agreed to proceed.     HPI: Gloria Martin is a 61 y.o. female who presents for EGD for evaluation of non-cardiac chest pain and abdominal pain .  Patient was most recently seen in the Gastroenterology Clinic on 11/14/21.  No interval change in medical history since that appointment. Please refer to that note for full details regarding GI history and clinical presentation.   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 Authorizing Provider  aspirin EC 81 MG tablet Take 81 mg by mouth daily. Swallow whole.   Yes [provider]  cyclobenzaprine (FLEXERIL) 10 MG tablet Take 1 tablet by mouth at bedtime as needed.   Yes [provider]  famotidine (PEPCID) 20 MG tablet Take 20 mg by mouth at bedtime.   Yes [provider]  pantoprazole (PROTONIX) 40 MG  tablet Take 1 tablet (40 mg total) by mouth daily. 10/06/21  Yes Ivonne Andrew, NP  rosuvastatin (CRESTOR) 10 MG tablet Take 1 tablet (10 mg total) by mouth daily. 10/20/21 04/18/22 Yes Zonia Kief, Amy J, NP  valsartan (DIOVAN) 160 MG tablet TAKE 1 TABLET BY MOUTH EVERY DAY 01/30/22  Yes Zonia Kief, Amy J, NP  meloxicam (MOBIC) 15 MG tablet TAKE 1 TABLET (15 MG TOTAL) BY MOUTH DAILY. 01/30/22   Rema Fendt, NP  SUMAtriptan (IMITREX) 50 MG tablet Take 1 tablet by mouth as needed.    [provider]    Current Outpatient Medications  Medication Sig Dispense Refill   aspirin EC 81 MG tablet Take 81 mg by mouth daily. Swallow whole.     cyclobenzaprine (FLEXERIL) 10 MG tablet Take 1 tablet by mouth at bedtime as needed.     famotidine (PEPCID) 20 MG tablet Take 20 mg by mouth at bedtime.     pantoprazole (PROTONIX) 40 MG tablet Take 1 tablet (40 mg total) by mouth daily. 30 tablet 3   rosuvastatin (CRESTOR) 10 MG tablet Take 1 tablet (10 mg total) by mouth daily. 90 tablet 1   valsartan (DIOVAN) 160 MG tablet TAKE 1 TABLET BY MOUTH EVERY DAY 90 tablet 0   meloxicam (MOBIC) 15 MG tablet TAKE 1 TABLET (15 MG TOTAL) BY MOUTH DAILY. 90 tablet 0   SUMAtriptan (IMITREX) 50 MG tablet Take 1 tablet by mouth as needed.  Current Facility-Administered Medications  Medication Dose Route Frequency Provider Last Rate Last Admin   0.9 %  sodium chloride infusion  500 mL Intravenous Once Imogene Burn, MD        Allergies as of 02/09/2022   (No Known Allergies)    Family History  Problem Relation Age of Onset   Hypertension Mother    High Cholesterol Mother    Heart disease Father    Hypertension Brother    Coronary artery disease Brother    Diabetes Maternal Aunt    Esophageal cancer Maternal Uncle    Heart disease Paternal Aunt    Diabetes Maternal Grandmother    Colon cancer Neg Hx    Colon polyps Neg Hx    Stomach cancer Neg Hx    Rectal cancer Neg Hx     Social History    Socioeconomic History   Marital status: Married    Spouse name: Not on file   Number of children: 1   Years of education: Not on file   Highest education level: Not on file  Occupational History   Not on file  Tobacco Use   Smoking status: Former    Packs/day: 1.00    Types: Cigarettes    Start date: 41    Quit date: 04/07/2020    Years since quitting: 1.8    Passive exposure: Never   Smokeless tobacco: Never  Vaping Use   Vaping Use: Never used  Substance and Sexual Activity   Alcohol use: No   Drug use: No   Sexual activity: Not on file  Other Topics Concern   Not on file  Social History Narrative   Not on file   Social Determinants of Health   Financial Resource Strain: Not on file  Food Insecurity: Not on file  Transportation Needs: Not on file  Physical Activity: Not on file  Stress: Not on file  Social Connections: Not on file  Intimate Partner Violence: Not on file    Physical Exam: Vital signs in last 24 hours: BP (!) 114/111   Pulse 98   Temp (!) 97.3 F (36.3 C) (Temporal)   Ht 5' 4.5" (1.638 m)   Wt 254 lb (115.2 kg)   SpO2 97%   BMI 42.93 kg/m  GEN: NAD EYE: Sclerae anicteric ENT: MMM CV: Non-tachycardic Pulm: No increased WOB GI: Soft NEURO:  Alert & Oriented   Eulah Pont, MD [facility]   02/09/2022 10:42 AM

## 2022-02-09 NOTE — Patient Instructions (Signed)
YOU HAD AN ENDOSCOPIC PROCEDURE TODAY AT THE [facility]:   Refer to the procedure report that was given to you for any specific questions about what was found during the examination.  If the procedure report does not answer your questions, please call your gastroenterologist to clarify.  If you requested that your care partner not be given the details of your procedure findings, then the procedure report has been included in a sealed envelope for you to review at your convenience later.  YOU SHOULD EXPECT: Some feelings of bloating in the abdomen. Passage of more gas than usual.  Walking can help get rid of the air that was put into your GI tract during the procedure and reduce the bloating. If you had a lower endoscopy (such as a colonoscopy or flexible sigmoidoscopy) you may notice spotting of blood in your stool or on the toilet paper. If you underwent a bowel prep for your procedure, you may not have a normal bowel movement for a few days.  Please Note:  You might notice some irritation and congestion in your nose or some drainage.  This is from the oxygen used during your procedure.  There is no need for concern and it should clear up in a day or so.  SYMPTOMS TO REPORT IMMEDIATELY:  Following upper endoscopy (EGD)  Vomiting of blood or coffee ground material  New chest pain or pain under the shoulder blades  Painful or persistently difficult swallowing  New shortness of breath  Fever of 100F or higher  Black, tarry-looking stools  For urgent or emergent issues, a gastroenterologist can be reached at any hour by calling [phone]. Do not use MyChart messaging for urgent concerns.    DIET:  We do recommend a small meal at first, but then you may proceed to your regular diet.  Drink plenty of fluids but you should avoid alcoholic beverages for 24 hours.  ACTIVITY:  You should plan to take it easy for the rest of today and you should NOT DRIVE or use heavy machinery until  tomorrow (because of the sedation medicines used during the test).    FOLLOW UP: Our staff will call the number listed on your records the next business day following your procedure.  We will call around 7:15- 8:00 am to check on you and address any questions or concerns that you may have regarding the information given to you following your procedure. If we do not reach you, we will leave a message.  If you develop any symptoms (ie: fever, flu-like symptoms, shortness of breath, cough etc.) before then, please call [phone].  If you test positive for Covid 19 in the 2 weeks post procedure, please call and report this information to us.    If any biopsies were taken you will be contacted by phone or by letter within the next 1-3 weeks.  Please call us at [phone] if you have not heard about the biopsies in 3 weeks.    SIGNATURES/CONFIDENTIALITY: You and/or your care partner have signed paperwork which will be entered into your electronic medical record.  These signatures attest to the fact that that the information above on your After Visit Summary has been reviewed and is understood.  Full responsibility of the confidentiality of this discharge information lies with you and/or your care-partner.  

## 2022-02-09 NOTE — Progress Notes (Signed)
Called to room to assist during endoscopic procedure.  Patient ID and intended procedure confirmed with present staff. Received instructions for my participation in the procedure from the performing physician.  

## 2022-02-09 NOTE — Op Note (Addendum)
[facility] Patient Name: Gloria Martin Procedure Date: 02/09/2022 11:16 AM MRN: [id number] Endoscopist: Nicole Kindred "Eulah Pont ,  Age: 61 Referring MD:  Date of Birth: 07-Feb-1961 Gender: Female Account #: [id number] Procedure:                Upper GI endoscopy Indications:              Abdominal pain, Chest pain (non cardiac) Medicines:                Monitored Anesthesia Care Procedure:                Pre-Anesthesia Assessment:                           - Prior to the procedure, a History and Physical                            was performed, and patient medications and                            allergies were reviewed. The patient's tolerance of                            previous anesthesia was also reviewed. The risks                            and benefits of the procedure and the sedation                            options and risks were discussed with the patient.                            All questions were answered, and informed consent                            was obtained. Prior Anticoagulants: The patient has                            taken no previous anticoagulant or antiplatelet                            agents. ASA Grade Assessment: III - A patient with                            severe systemic disease. After reviewing the risks                            and benefits, the patient was deemed in                            satisfactory condition to undergo the procedure.                           After obtaining informed consent, the endoscope was  passed under direct vision. Throughout the                            procedure, the patient's blood pressure, pulse, and                            oxygen saturations were monitored continuously. The                            Endoscope was introduced through the mouth, and                            advanced to the second part of duodenum. The upper                            GI  endoscopy was accomplished without difficulty.                            The patient tolerated the procedure well. Scope In: Scope Out: 11:26:57 AM Findings:                 The examined esophagus was normal. Biopsies were                            taken with a cold forceps for histology.                           Localized inflammation characterized by congestion                            (edema), erosions and erythema was found in the                            gastric antrum. Biopsies were taken with a cold                            forceps for histology.                           The examined duodenum was normal. Biopsies were                            taken with a cold forceps for histology. Complications:            No immediate complications. Estimated Blood Loss:     Estimated blood loss was minimal. Impression:               - Normal esophagus. Biopsied.                           - Gastritis. Biopsied.                           - Normal examined duodenum. Biopsied. Recommendation:           - Discharge patient to home (with escort).                           -  Await pathology results.                           - Use Prilosec (omeprazole) 40 mg PO BID for 8                            weeks.                           - Stop pantoprazole.                           - Avoid meloxicam and other NSAIDs if possible.                           - Return to GI clinic in 6 weeks.                           - The findings and recommendations were discussed                            with the patient. [address]Eulah Pont,  02/09/2022 11:29:49 AM

## 2022-02-09 NOTE — Progress Notes (Deleted)
Called to room to assist during endoscopic procedure.  Patient ID and intended procedure confirmed with present staff. Received instructions for my participation in the procedure from the performing physician.  

## 2022-02-09 NOTE — Progress Notes (Signed)
Report given to PACU, vss 

## 2022-02-09 NOTE — Progress Notes (Signed)
Pt's states no medical or surgical changes since previsit or office visit. 

## 2022-02-10 ENCOUNTER — Telehealth: Payer: Self-pay

## 2022-02-10 NOTE — Telephone Encounter (Signed)
  Follow up Call-     02/09/2022    9:51 AM  Call back number  Post procedure Call Back phone  # [phone]  Permission to leave phone message Yes    Post op call attempted, no answer, left WM.

## 2022-02-13 ENCOUNTER — Telehealth: Payer: 59 | Admitting: Internal Medicine

## 2022-02-13 ENCOUNTER — Ambulatory Visit: Payer: 59 | Admitting: Rehabilitative and Restorative Service Providers"

## 2022-02-13 ENCOUNTER — Encounter: Payer: Self-pay | Admitting: Internal Medicine

## 2022-02-13 LAB — COLOGUARD: COLOGUARD: NEGATIVE

## 2022-02-14 ENCOUNTER — Other Ambulatory Visit (HOSPITAL_COMMUNITY): Payer: Self-pay

## 2022-02-14 ENCOUNTER — Telehealth: Payer: Self-pay | Admitting: Pharmacy Technician

## 2022-02-14 NOTE — Telephone Encounter (Signed)
Is this a PA issue for BID dosing?

## 2022-02-14 NOTE — Telephone Encounter (Signed)
Patient Advocate Encounter  Received notification from [facility] OFFICE that prior authorization for OMEPRAZOLE 40MG  is required.   PA submitted on 7.25.23 Key [id number] Status is pending    10-28-1994, CPhT Patient Advocate Phone: [phone]

## 2022-02-15 ENCOUNTER — Other Ambulatory Visit: Payer: Self-pay

## 2022-02-15 DIAGNOSIS — K259 Gastric ulcer, unspecified as acute or chronic, without hemorrhage or perforation: Secondary | ICD-10-CM

## 2022-02-15 MED ORDER — OMEPRAZOLE 40 MG PO CPDR: 40.0000 mg | DELAYED_RELEASE_CAPSULE | Freq: Every day | ORAL | 1 refills | Status: DC

## 2022-02-15 NOTE — Telephone Encounter (Signed)
Spoke with the patient. She will pick up a 90 day Rx for once daily dosing until the PA issue is resolved with her insurance.

## 2022-02-16 ENCOUNTER — Other Ambulatory Visit (HOSPITAL_COMMUNITY): Payer: Self-pay

## 2022-02-17 ENCOUNTER — Ambulatory Visit: Payer: 59 | Admitting: Rehabilitative and Restorative Service Providers"

## 2022-02-17 ENCOUNTER — Other Ambulatory Visit: Payer: Self-pay

## 2022-02-17 ENCOUNTER — Encounter: Payer: Self-pay | Admitting: Rehabilitative and Restorative Service Providers"

## 2022-02-17 DIAGNOSIS — R2689 Other abnormalities of gait and mobility: Secondary | ICD-10-CM | POA: Diagnosis not present

## 2022-02-17 DIAGNOSIS — M6281 Muscle weakness (generalized): Secondary | ICD-10-CM | POA: Diagnosis not present

## 2022-02-17 DIAGNOSIS — M5459 Other low back pain: Secondary | ICD-10-CM

## 2022-02-17 DIAGNOSIS — R293 Abnormal posture: Secondary | ICD-10-CM | POA: Diagnosis not present

## 2022-02-17 NOTE — Therapy (Addendum)
OUTPATIENT PHYSICAL THERAPY EVALUATION  /DISCHARGE   Patient Name: Latima Hamza MRN: [id number] DOB:1960/09/15, 61 y.o., female Today's Date: 02/17/2022  End of Session  PT End of Session - 02/17/22 0755     Visit Number 1    Number of Visits 20    Date for PT Re-Evaluation 04/28/22    Authorization Type [employer]    Authorization Time Period $10 co-pay, 30 visits    Progress Note Due on Visit 10    PT Start Time 0757    PT Stop Time 0855    PT Time Calculation (min) 58 min    Activity Tolerance Patient limited by pain    Behavior During Therapy [facility] for tasks assessed/performed             Past Medical History:  Diagnosis Date   Bone spur of other site    Spine   Chest pain 08/09/2017   Chronic low back pain 12/24/2017   Cramps of lower extremity 01/26/2018   Essential hypertension 08/09/2017   GERD (gastroesophageal reflux disease)    High cholesterol    Hypertension    Kidney stones    Leg cramps    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   Mixed hyperlipidemia 04/06/2020   Obesity    Overweight 08/09/2017   Peripheral vascular disease ([place]) 08/17/2021   Prediabetes 08/11/2021   Past Surgical History:  Procedure Laterality Date   TUBAL LIGATION     WISDOM TOOTH EXTRACTION Bilateral    Patient Active Problem List   Diagnosis Date Noted   Myofascial pain dysfunction syndrome 01/30/2022   Intermittent chest pain 10/06/2021   Peripheral vascular disease ([place]) 08/17/2021   Prediabetes 08/11/2021   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   Mixed hyperlipidemia 04/06/2020   Cramps of lower extremity 01/26/2018   Chronic low back pain 12/24/2017   Chest pain 08/09/2017   Overweight 08/09/2017   Essential hypertension 08/09/2017    PCP: Camillia Herter, NP  REFERRING PROVIDER: Courtney Heys, MD  REFERRING DIAG: M54.41,M54.42,G89.29 (ICD-10-CM) - Chronic midline low back pain with bilateral sciatica  Rationale for Evaluation and Treatment  Rehabilitation  THERAPY DIAG:  Other low back pain  Muscle weakness (generalized)  Other abnormalities of gait and mobility  Abnormal posture  ONSET DATE: 01/30/22 referral from MD  SUBJECTIVE:                                                                                                                                                                                           SUBJECTIVE STATEMENT: She has been dealing with low back pain since 1982 after pulling the muscles in her back. She  has leg cramps since 2019 with no resolution and she has tingling in bil. toes. She cannot take pain medication right now due to GI concerns. She recently got a new health insurance that allowed her to begin PT, and the health insurance will change in August.  Pt indicated concerns about her ability to perform movement of daily life without pain increases, as well as concerns about ability to perform recommend exercise interventions from other providers for general health.   PERTINENT HISTORY:  GERD, HTN, high cholesterol, PVD  PAIN:  Are you having pain? Yes: NPRS scale: 6/10, ranges 3-10/10 Pain location: center between shoulder blades and down to bottom Pain description: smacking, constant Aggravating factors: standing longer than 20-30 minutes Relieving factors: getting up and moving, heating pad, ibuprofen and meloxicam but can't take either   PRECAUTIONS: None  WEIGHT BEARING RESTRICTIONS No  FALLS:  Has patient fallen in last 6 months? 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 PSIS and spinous processes L3-5 Tightness in paraspinal musculature   LUMBAR ROM:   ROM AROM  02/17/22*  Flexion 75% and painful  Extension 50% more painful than flexion  Right lateral flexion 75% and painful  Left lateral flexion 75% most painful   Right rotation   Left rotation    (Blank rows = not tested) *pain in central low back, none relieved by repeating motion  LOWER EXTREMITY ROM:     ROM Right 02/17/22 Left 02/17/22  Hip flexion    Hip extension    Hip abduction    Hip adduction    Hip internal rotation    Hip external rotation    Knee flexion    Knee extension    Ankle dorsiflexion    Ankle plantarflexion    Ankle inversion    Ankle eversion     (Blank rows = not tested)  LOWER EXTREMITY MMT:    MMT Seated Right 02/17/22 Left 02/17/22  Hip flexion 4-/5 3+/5  Hip extension    Hip abduction    Hip adduction    Hip internal rotation    Hip external rotation    Knee flexion 3/5 3+/5  Knee extension 3+/5 3/5  Ankle dorsiflexion 4/5 4/5  Ankle plantarflexion    Ankle inversion    Ankle eversion     (Blank rows = not tested)  SPECIAL TESTS:  02/17/22 Leg length: Rt 79 cm, Lt 80 cm  TRANSFERS: 02/17/22 sit to/from supine: high increase in pain, leans back and lifts legs then pivots to supine  GAIT: 02/17/22 Distance walked: 100' Assistive device utilized: None Level of assistance: Complete Independence Comments: increased trunk flexion, shortened step length, limited trunk and pelvic rotation   TODAY'S TREATMENT  02/17/22 TherEx: Reviewed HEP [email or web address] below, patient performed trial reps x 2 for  comprehension with verbal  and tactile cueing for technique PT education on Theracane use, TENS settings and indication, exercise and walking suggestions, pain sensitization with visual demonstration - pt verbalized understanding  E-Stim: IFC lower lumbar x 12 minutes intensity to tolerance in sitting, no adverse reaction  PATIENT EDUCATION:  Education details: Designer, television/film set Person educated: Patient Education method: Explanation, Corporate treasurer cues, Verbal cues, and Handouts Education comprehension: verbalized understanding, returned demonstration, and verbal cues required   HOME EXERCISE PROGRAM: Access Code: [email or web address] URL: [email or web address] Date: 02/17/2022 Prepared by: Scot Jun  Exercises - Supine Lower Trunk Rotation  - 2-3 x daily - 7 x weekly - 1 sets - 3-5 reps - 15 hold - Supine Single Knee to Chest Stretch  - 2-3 x daily - 7 x weekly - 1 sets - 3-5 reps - 15 hold - Hooklying Gluteal Sets  - 2-3 x daily - 7 x weekly - 1 sets - 10 reps - 5 hold - Seated Scapular Retraction  - 3-5 x daily - 7 x weekly - 1 sets - 10 reps - 3-5 hold  ASSESSMENT:  CLINICAL IMPRESSION: Patient is a 61 y.o. female who was seen today for physical therapy evaluation and treatment for chronic midline low back pain. She has pain and limitations in all lumbar motions, bilateral LE weakness, tight paraspinal musculature, and postural abnormalities. She will benefit from skilled PT to address impairments in order to progress towards meeting her goals.   OBJECTIVE IMPAIRMENTS Abnormal gait, cardiopulmonary status limiting activity, decreased activity tolerance, decreased endurance, decreased mobility, difficulty walking, decreased ROM, decreased strength, increased muscle spasms, impaired flexibility, postural dysfunction, obesity, and pain.   ACTIVITY LIMITATIONS carrying, lifting, bending, sitting, standing, transfers, bed mobility, and locomotion level  PARTICIPATION LIMITATIONS: cleaning, laundry, and community  activity  PERSONAL FACTORS Fitness and 3+ comorbidities: see PMH  are also affecting patient's functional outcome.   REHAB POTENTIAL: Good  CLINICAL DECISION MAKING: Stable/uncomplicated  EVALUATION COMPLEXITY: Low   GOALS: Goals reviewed with patient? Yes  SHORT TERM GOALS: Target date: 03/10/22  Patient will be independent with HEP to continue progressing outside of clinic. Baseline: see objective data Goal status: INITIAL  LONG TERM GOALS: Target date: 04/28/22  Patient will score FOTO >/= 55% to reflect improved self-perceived functional ability Baseline: see objective data Goal status: INITIAL   2.  Patient will be independent with a HEP to maintain and progress functional gains from skilled PT. Baseline: see objective data Goal status: INITIAL   3.  Patient will report </= 2/10 pain with sitting, standing, and walking activities. Baseline: see objective data Goal status: INITIAL   4. Patient presents with lumbar motions WNL without increases in pain intensity or peripheralization to allow for normal functional movement ability. Baseline: see objective data Goal status: INITIAL  5. Patient verbalizes and demonstrates ability to perform household chores and cleaning without > 2/10 increase in pain. Baseline: see objective data Goal status: INITIAL  6.  Patient will have 5/5 BLE MMT scores for knee flex/ext, ankle DF, hip flexion. Baseline: see objective data Goal status: INITIAL   PLAN: PT FREQUENCY: 1-2x/week  PT DURATION: 10 weeks  PLANNED INTERVENTIONS: Therapeutic exercises, Therapeutic activity, Neuromuscular re-education, Balance training, Gait training, Patient/Family education, Self Care, Joint mobilization, Aquatic Therapy, Dry Needling, Electrical stimulation, Spinal mobilization, Cryotherapy, Moist heat, Taping, Ultrasound, Ionotophoresis 21m/ml Dexamethasone, Manual therapy, Re-evaluation, and physical performance tests .  PLAN FOR NEXT SESSION:  review HEP, bed mobility review/improvements, progress lumbar mobility   Sora Vrooman, Student-PT  02/17/2022, 9:45 AM  This entire session of physical therapy was performed under the direct supervision of PT signing evaluation /treatment. PT reviewed note and agrees.  Scot Jun, PT, DPT, OCS, ATC 02/17/2022, 12:45 PM   PHYSICAL THERAPY DISCHARGE SUMMARY  Visits from Start of Care: 1  Current functional level related to goals / functional outcomes: See note   Remaining deficits: See note   Education / Equipment: HEP  Patient goals were not met. Patient is being discharged due to not returning since the last visit.  Scot Jun, PT, DPT, OCS, ATC 05/02/22  1:26 PM

## 2022-02-21 ENCOUNTER — Encounter: Payer: 59 | Admitting: Rehabilitative and Restorative Service Providers"

## 2022-03-03 ENCOUNTER — Encounter: Payer: Self-pay | Admitting: Internal Medicine

## 2022-03-08 ENCOUNTER — Encounter: Payer: 59 | Admitting: Rehabilitative and Restorative Service Providers"

## 2022-03-09 ENCOUNTER — Other Ambulatory Visit (HOSPITAL_COMMUNITY): Payer: Self-pay

## 2022-03-16 ENCOUNTER — Encounter: Payer: 59 | Admitting: Rehabilitative and Restorative Service Providers"

## 2022-03-23 ENCOUNTER — Encounter: Payer: 59 | Admitting: Rehabilitative and Restorative Service Providers"

## 2022-05-26 ENCOUNTER — Ambulatory Visit: Payer: 59 | Admitting: Physical Medicine and Rehabilitation

## 2022-05-29 ENCOUNTER — Encounter
Payer: Commercial Managed Care - HMO | Attending: Physical Medicine and Rehabilitation | Admitting: Physical Medicine & Rehabilitation

## 2022-05-29 ENCOUNTER — Encounter: Payer: Self-pay | Admitting: Physical Medicine & Rehabilitation

## 2022-05-29 VITALS — BP 156/97 | HR 93 | Ht 64.5 in

## 2022-05-29 DIAGNOSIS — M546 Pain in thoracic spine: Secondary | ICD-10-CM | POA: Diagnosis not present

## 2022-05-29 DIAGNOSIS — G8929 Other chronic pain: Secondary | ICD-10-CM | POA: Diagnosis not present

## 2022-05-29 DIAGNOSIS — M545 Low back pain, unspecified: Secondary | ICD-10-CM | POA: Diagnosis not present

## 2022-05-29 DIAGNOSIS — M62838 Other muscle spasm: Secondary | ICD-10-CM

## 2022-05-29 NOTE — Progress Notes (Signed)
Subjective:    Patient ID: Gloria Martin, female    DOB: 08/26/1960, 61 y.o.   MRN: [id number]  HPI Gloria Martin is a 61 year old female with past medical history of obesity, hypertension, prediabetes who is here for neck and back pain.  She was previously seen by Dr. Dagoberto Ligas.  She continues have pain in her back and neck.  She also has cramping pain in her legs and thighs.  Leg cramps are improved with Flexeril.  Previously meloxicam or other NSAIDs were helping his pain however she was told to discontinue NSAIDs by GI.  She recently tried physical therapy however this was stopped due to insurance issues.  She continues with home exercises that were recommended.  She is considering going to a Restaurant manager, fast food.  Tylenol does not provide significant benefit.  Her PCP is started gabapentin 100 mg 3 times daily however she has not tried this medication.   Pain Inventory Average Pain 10 Pain Right Now 7 My pain is sharp and stabbing  In the last 24 hours, has pain interfered with the following? General activity 7 Relation with others 5 Enjoyment of life 8 What TIME of day is your pain at its worst? evening Sleep (in general) Fair  Pain is worse with: bending and inactivity Pain improves with: heat/ice Relief from Meds: 0  Family History  Problem Relation Age of Onset   Hypertension Mother    High Cholesterol Mother    Heart disease Father    Hypertension Brother    Coronary artery disease Brother    Diabetes Maternal Aunt    Esophageal cancer Maternal Uncle    Heart disease Paternal Aunt    Diabetes Maternal Grandmother    Colon cancer Neg Hx    Colon polyps Neg Hx    Stomach cancer Neg Hx    Rectal cancer Neg Hx    Social History   Socioeconomic History   Marital status: Married    Spouse name: Not on file   Number of children: 1   Years of education: Not on file   Highest education level: Not on file  Occupational History   Not on file  Tobacco Use   Smoking status:  Former    Packs/day: 1.00    Types: Cigarettes    Start date: 69    Quit date: 04/07/2020    Years since quitting: 2.1    Passive exposure: Never   Smokeless tobacco: Never  Vaping Use   Vaping Use: Never used  Substance and Sexual Activity   Alcohol use: No   Drug use: No   Sexual activity: Not on file  Other Topics Concern   Not on file  Social History Narrative   Not on file   Social Determinants of Health   Financial Resource Strain: Not on file  Food Insecurity: Not on file  Transportation Needs: Not on file  Physical Activity: Not on file  Stress: Not on file  Social Connections: Not on file   Past Surgical History:  Procedure Laterality Date   TUBAL LIGATION     WISDOM TOOTH EXTRACTION Bilateral    Past Surgical History:  Procedure Laterality Date   TUBAL LIGATION     WISDOM TOOTH EXTRACTION Bilateral    Past Medical History:  Diagnosis Date   Bone spur of other site    Spine   Chest pain 08/09/2017   Chronic low back pain 12/24/2017   Cramps of lower extremity 01/26/2018   Essential hypertension 08/09/2017  GERD (gastroesophageal reflux disease)    High cholesterol    Hypertension    Kidney stones    Leg cramps    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   Mixed hyperlipidemia 04/06/2020   Obesity    Overweight 08/09/2017   Peripheral vascular disease ([facility]) 08/17/2021   Prediabetes 08/11/2021   BP (!) 156/97   Pulse 93   Ht 5' 4.5" (1.638 m)   SpO2 98%   BMI 42.93 kg/m   Opioid Risk Score:   Fall Risk Score:  `1  Depression screen [facility] 2/9     05/29/2022   10:51 AM 01/30/2022    2:24 PM 10/20/2021    8:50 AM  Depression screen PHQ 2/9  Decreased Interest 0 2 0  Down, Depressed, Hopeless 0 0 1  PHQ - 2 Score 0 2 1  Altered sleeping  1   Tired, decreased energy  1   Change in appetite  1   Feeling bad or failure about yourself   0   Trouble concentrating  0   Moving slowly or fidgety/restless  0   Suicidal thoughts  0   PHQ-9 Score  5    Difficult doing work/chores  Somewhat difficult       Review of Systems  Musculoskeletal:  Positive for back pain and neck pain.  All other systems reviewed and are negative.     Objective:   Physical Exam  Gen: no distress, normal appearing HEENT: oral mucosa pink and moist, NCAT Chest: normal effort, normal rate of breathing Abd: soft, non-distended Ext: no edema Psych: pleasant, normal affect Skin: intact Neuro: Cn 2-12 intact , awake and alert Musculoskeletal: L and T spine paraspinal tenderness No focal neurological deficits noted  Awake, alert, appropriate, sitting on table, NAD Tenderness upper traps, levators, scalenes, Rhomboids, lats SLR neg  Pearlean Brownie, Fadir negative Kyphotic posture  Facet loading negative   L spine Xray 03/30/21 FINDINGS: Five lumbar type vertebra. Alignment within normal limits. Minimal chronic appearing wedging of T12. Moderate degenerative changes at T11-T12 and T12-L1. Mild degenerative osteophytes throughout the lumbar spine. Aortic atherosclerosis   IMPRESSION: Mild degenerative changes.  Xray T spine 03/30/21  FINDINGS: Thoracic alignment within normal limits. Vertebral body heights are maintained. Multiple level degenerative osteophyte with mild disc space narrowing   IMPRESSION: Degenerative changes.  No acute osseous abnormality      Assessment & Plan:  61 year old female with chronic neck and back pain and muscle cramps in her bilateral lower extremities   1.  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 -Continue home exercise program.  Can consider repeat PT if it becomes available as an option.  She says this was previously limited by insurance -Continue plan for gabapentin 100 mg 3 times daily, can increase dose if needed.  Patient plans to try when her husband is back in town so he can be there if she has any side effects.  She reports she has already received this medication from her PCP  but has not tried it yet -Duloxetine may be a good option if different medication is needed -We will not start NSAIDs today due to history of gastric discomfort followed by GI, she is on PPI therapy currently     2.  Muscle spasms -Continue magnesium and Flexeril as needed at bedtime

## 2022-07-10 ENCOUNTER — Encounter: Payer: 59 | Admitting: Physical Medicine & Rehabilitation

## 2022-07-14 ENCOUNTER — Other Ambulatory Visit: Payer: Self-pay | Admitting: Family

## 2022-07-14 DIAGNOSIS — G8929 Other chronic pain: Secondary | ICD-10-CM

## 2022-07-14 DIAGNOSIS — R079 Chest pain, unspecified: Secondary | ICD-10-CM

## 2022-07-18 NOTE — Telephone Encounter (Signed)
Refused Mobic 15 mg because it was discontinued 10/20/2021.

## 2022-07-25 ENCOUNTER — Encounter: Payer: Self-pay | Admitting: Physical Medicine & Rehabilitation

## 2022-07-25 ENCOUNTER — Encounter: Payer: 59 | Attending: Physical Medicine & Rehabilitation | Admitting: Physical Medicine & Rehabilitation

## 2022-07-25 VITALS — BP 168/114 | HR 88 | Ht 64.5 in | Wt 262.0 lb

## 2022-07-25 DIAGNOSIS — M542 Cervicalgia: Secondary | ICD-10-CM | POA: Insufficient documentation

## 2022-07-25 DIAGNOSIS — M545 Low back pain, unspecified: Secondary | ICD-10-CM | POA: Insufficient documentation

## 2022-07-25 DIAGNOSIS — G8929 Other chronic pain: Secondary | ICD-10-CM | POA: Diagnosis not present

## 2022-07-25 DIAGNOSIS — M546 Pain in thoracic spine: Secondary | ICD-10-CM | POA: Diagnosis not present

## 2022-07-25 MED ORDER — GABAPENTIN 100 MG PO CAPS
300.0000 mg | ORAL_CAPSULE | Freq: Three times a day (TID) | ORAL | 3 refills | Status: DC
Start: 2022-07-25 — End: 2022-07-25

## 2022-07-25 MED ORDER — GABAPENTIN 100 MG PO CAPS
300.0000 mg | ORAL_CAPSULE | Freq: Three times a day (TID) | ORAL | 3 refills | Status: DC
Start: 2022-07-25 — End: 2022-08-04

## 2022-07-25 MED ORDER — METHOCARBAMOL 500 MG PO TABS: 500.0000 mg | ORAL_TABLET | Freq: Three times a day (TID) | ORAL | 2 refills | Status: DC | PRN

## 2022-07-25 NOTE — Progress Notes (Addendum)
Subjective:    Patient ID: Gloria Martin, female    DOB: 1961/06/01, 62 y.o.   MRN: [id number]  HPI HPI 05/29/22  Gloria Martin is a 62 year old female with past medical history of obesity, hypertension, prediabetes who is here for neck and back pain.  She was previously seen by Dr. Dagoberto Ligas.  She continues have pain in her back and neck.  She also has cramping pain in her legs and thighs.  Leg cramps are improved with Flexeril.  Previously meloxicam or other NSAIDs were helping his pain however she was told to discontinue NSAIDs by GI.  She recently tried physical therapy however this was stopped due to insurance issues.  She continues with home exercises that were recommended.  She is considering going to a Restaurant manager, fast food.  Tylenol does not provide significant benefit.  Her PCP is started gabapentin 100 mg 3 times daily however she has not tried this medication.  Interval History Gloria Martin is here for follow-up due to her chronic pain.  Pain continues to be primarily around her lower back up to her neck and shoulders.  She also continues to have leg cramps in her thighs.  She reports the Flexeril is not helping the muscle cramps.  It did help in the past but is no longer working.  She has noted benefit with gabapentin and has noted improvement in her neck and back pain.  Gabapentin is not causing sedation or any other side effects at this time.  Pain Inventory Average Pain 7 Pain Right Now 5 My pain is constant, tingling, and aching  In the last 24 hours, has pain interfered with the following? General activity 5 Relation with others 10 Enjoyment of life 3 What TIME of day is your pain at its worst? evening and night Sleep (in general) Fair  Pain is worse with: walking, bending, and some activites Pain improves with: rest Relief from Meds: 5  Family History  Problem Relation Age of Onset   Hypertension Mother    High Cholesterol Mother    Heart disease Father    Hypertension Brother     Coronary artery disease Brother    Diabetes Maternal Aunt    Esophageal cancer Maternal Uncle    Heart disease Paternal Aunt    Diabetes Maternal Grandmother    Colon cancer Neg Hx    Colon polyps Neg Hx    Stomach cancer Neg Hx    Rectal cancer Neg Hx    Social History   Socioeconomic History   Marital status: Married    Spouse name: Not on file   Number of children: 1   Years of education: Not on file   Highest education level: Not on file  Occupational History   Not on file  Tobacco Use   Smoking status: Former    Packs/day: 1.00    Types: Cigarettes    Start date: 28    Quit date: 04/07/2020    Years since quitting: 2.2    Passive exposure: Never   Smokeless tobacco: Never  Vaping Use   Vaping Use: Never used  Substance and Sexual Activity   Alcohol use: No   Drug use: No   Sexual activity: Not on file  Other Topics Concern   Not on file  Social History Narrative   Not on file   Social Determinants of Health   Financial Resource Strain: Not on file  Food Insecurity: Not on file  Transportation Needs: Not on file  Physical Activity: Not  on file  Stress: Not on file  Social Connections: Not on file   Past Surgical History:  Procedure Laterality Date   TUBAL LIGATION     WISDOM TOOTH EXTRACTION Bilateral    Past Surgical History:  Procedure Laterality Date   TUBAL LIGATION     WISDOM TOOTH EXTRACTION Bilateral    Past Medical History:  Diagnosis Date   Bone spur of other site    Spine   Chest pain 08/09/2017   Chronic low back pain 12/24/2017   Cramps of lower extremity 01/26/2018   Essential hypertension 08/09/2017   GERD (gastroesophageal reflux disease)    High cholesterol    Hypertension    Kidney stones    Leg cramps    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   Mixed hyperlipidemia 04/06/2020   Obesity    Overweight 08/09/2017   Peripheral vascular disease ([facility]) 08/17/2021   Prediabetes 08/11/2021   BP (!) 168/114   Pulse 88   Ht 5'  4.5" (1.638 m)   Wt 262 lb (118.8 kg)   SpO2 96%   BMI 44.28 kg/m   Opioid Risk Score:   Fall Risk Score:  `1  Depression screen [facility] 2/9     07/25/2022   12:16 PM 05/29/2022   10:51 AM 01/30/2022    2:24 PM 10/20/2021    8:50 AM  Depression screen PHQ 2/9  Decreased Interest 0 0 2 0  Down, Depressed, Hopeless 0 0 0 1  PHQ - 2 Score 0 0 2 1  Altered sleeping   1   Tired, decreased energy   1   Change in appetite   1   Feeling bad or failure about yourself    0   Trouble concentrating   0   Moving slowly or fidgety/restless   0   Suicidal thoughts   0   PHQ-9 Score   5   Difficult doing work/chores   Somewhat difficult     Review of Systems  Musculoskeletal:  Positive for back pain and neck pain.       Pain all over the body  Neurological:  Negative for weakness.  All other systems reviewed and are negative.      Objective:   Physical Exam     Gen: no distress, normal appearing HEENT: oral mucosa pink and moist, NCAT Chest: normal effort, normal rate of breathing Abd: soft, non-distended Ext: no edema Psych: pleasant, normal affect Skin: intact Neuro: Cn 2-12 grossly intact , awake and alert Musculoskeletal: L and T spine paraspinal tenderness No focal neurological deficits noted  Awake, alert, appropriate, sitting on table, NAD Tenderness upper traps, levators, scalenes, Rhomboids, lats slump neg  Kyphotic posture  Facet loading negative Spurlings test negative   L spine Xray 03/30/21 FINDINGS: Five lumbar type vertebra. Alignment within normal limits. Minimal chronic appearing wedging of T12. Moderate degenerative changes at T11-T12 and T12-L1. Mild degenerative osteophytes throughout the lumbar spine. Aortic atherosclerosis   IMPRESSION: Mild degenerative changes.   Xray T spine 03/30/21  FINDINGS: Thoracic alignment within normal limits. Vertebral body heights are maintained. Multiple level degenerative osteophyte with mild disc space narrowing    IMPRESSION: Degenerative changes.  No acute osseous abnormality    Assessment & Plan:  62 year old female with chronic neck and back pain and muscle cramps in her bilateral lower extremities   1.  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 -Continue home exercise program.  Can consider repeat PT if it  becomes available as an option.  She says this was previously limited by insurance -Increase gabapentin to 200mg  TID for 1 week, if tolerating this dose then increase to 300mg  TID -Duloxetine may be a good option if different medication is needed -We will not start NSAIDs today due to history of gastric discomfort followed by GI, she is on PPI therapy currently     2.  Muscle spasms -Continue magnesium  -DC flexeril, start robaxin 500mg  TID prn  08/08/21 Addendum- insurance did not approve gabapentin 9 tabs a day, will change to 200mg  TID, if she tolerates this can consider on going up to the 300mg  dose next. Called to discuss, left discrete VM

## 2022-08-01 ENCOUNTER — Other Ambulatory Visit: Payer: Self-pay

## 2022-08-01 DIAGNOSIS — K259 Gastric ulcer, unspecified as acute or chronic, without hemorrhage or perforation: Secondary | ICD-10-CM

## 2022-08-01 MED ORDER — OMEPRAZOLE 40 MG PO CPDR: 40.0000 mg | DELAYED_RELEASE_CAPSULE | Freq: Every day | ORAL | 1 refills | Status: DC

## 2022-08-01 NOTE — Telephone Encounter (Signed)
Omeprazole refilled to [facility]. Gloria Martin is up to date on her office visits.

## 2022-08-02 ENCOUNTER — Telehealth: Payer: Self-pay

## 2022-08-02 NOTE — Telephone Encounter (Signed)
Gloria Martin would like the Methocarbamol & Gabapentin to be delivered by [facility].  Both will need to be a  90 day supplies.   Please submit or advise.

## 2022-08-03 ENCOUNTER — Other Ambulatory Visit: Payer: Self-pay

## 2022-08-03 DIAGNOSIS — E782 Mixed hyperlipidemia: Secondary | ICD-10-CM

## 2022-08-03 MED ORDER — ROSUVASTATIN CALCIUM 10 MG PO TABS: 10.0000 mg | ORAL_TABLET | Freq: Every day | ORAL | 1 refills | Status: DC

## 2022-08-04 MED ORDER — GABAPENTIN 100 MG PO CAPS: 300.0000 mg | ORAL_CAPSULE | Freq: Three times a day (TID) | ORAL | 3 refills | Status: DC

## 2022-08-04 MED ORDER — METHOCARBAMOL 500 MG PO TABS: 500.0000 mg | ORAL_TABLET | Freq: Three times a day (TID) | ORAL | 2 refills | Status: DC | PRN

## 2022-08-04 NOTE — Addendum Note (Signed)
Addended by: Jennye Boroughs on: 08/04/2022 04:18 PM   Modules accepted: Orders

## 2022-08-08 ENCOUNTER — Other Ambulatory Visit (HOSPITAL_COMMUNITY): Payer: Self-pay | Admitting: Physical Medicine & Rehabilitation

## 2022-08-08 ENCOUNTER — Telehealth: Payer: Self-pay | Admitting: Physical Medicine & Rehabilitation

## 2022-08-08 MED ORDER — GABAPENTIN 100 MG PO CAPS: 200.0000 mg | ORAL_CAPSULE | Freq: Three times a day (TID) | ORAL | 3 refills | Status: DC

## 2022-08-08 NOTE — Addendum Note (Signed)
Addended by: Jennye Boroughs on: 08/08/2022 01:36 PM   Modules accepted: Orders

## 2022-08-08 NOTE — Telephone Encounter (Signed)
Patient returning Dr. Alyson Reedy call.

## 2022-09-06 ENCOUNTER — Other Ambulatory Visit: Payer: Self-pay | Admitting: Obstetrics and Gynecology

## 2022-09-06 DIAGNOSIS — N6489 Other specified disorders of breast: Secondary | ICD-10-CM

## 2022-09-19 ENCOUNTER — Other Ambulatory Visit: Payer: 59

## 2022-09-19 ENCOUNTER — Ambulatory Visit
Admission: RE | Admit: 2022-09-19 | Discharge: 2022-09-19 | Disposition: A | Discharge status: Home / Self Care | Payer: Medicaid Other | Source: Ambulatory Visit | Attending: Obstetrics and Gynecology | Admitting: Obstetrics and Gynecology

## 2022-09-19 ENCOUNTER — Ambulatory Visit
Admission: RE | Admit: 2022-09-19 | Discharge: 2022-09-19 | Disposition: A | Discharge status: Home / Self Care | Payer: 59 | Source: Ambulatory Visit | Attending: Obstetrics and Gynecology | Admitting: Obstetrics and Gynecology

## 2022-09-19 DIAGNOSIS — N6489 Other specified disorders of breast: Secondary | ICD-10-CM

## 2022-09-25 ENCOUNTER — Ambulatory Visit: Payer: 59 | Admitting: Physical Medicine & Rehabilitation

## 2022-09-28 ENCOUNTER — Encounter: Payer: Medicaid Other | Attending: Physical Medicine & Rehabilitation | Admitting: Physical Medicine & Rehabilitation

## 2022-09-28 ENCOUNTER — Encounter: Payer: Self-pay | Admitting: Physical Medicine & Rehabilitation

## 2022-09-28 VITALS — BP 112/76 | HR 102 | Ht 64.5 in | Wt 260.0 lb

## 2022-09-28 DIAGNOSIS — M545 Low back pain, unspecified: Secondary | ICD-10-CM

## 2022-09-28 DIAGNOSIS — M62838 Other muscle spasm: Secondary | ICD-10-CM

## 2022-09-28 DIAGNOSIS — M546 Pain in thoracic spine: Secondary | ICD-10-CM

## 2022-09-28 DIAGNOSIS — G8929 Other chronic pain: Secondary | ICD-10-CM

## 2022-09-28 MED ORDER — GABAPENTIN 100 MG PO CAPS: 100.0000 mg | ORAL_CAPSULE | Freq: Four times a day (QID) | ORAL | 3 refills | Status: DC

## 2022-09-28 NOTE — Progress Notes (Signed)
Subjective:    Patient ID: Gloria Martin, female    DOB: 06-01-1961, 62 y.o.   MRN: [id number]  HPI HPI 05/29/22  Gloria Martin is a 62 year old female with past medical history of obesity, hypertension, prediabetes who is here for neck and back pain.  She was previously seen by Dr. Dagoberto Ligas.  She continues have pain in her back and neck.  She also has cramping pain in her legs and thighs.  Leg cramps are improved with Flexeril.  Previously meloxicam or other NSAIDs were helping his pain however she was told to discontinue NSAIDs by GI.  She recently tried physical therapy however this was stopped due to insurance issues.  She continues with home exercises that were recommended.  She is considering going to a Restaurant manager, fast food.  Tylenol does not provide significant benefit.  Her PCP is started gabapentin 100 mg 3 times daily however she has not tried this medication.   Interval History 07/25/22 Gloria Martin is here for follow-up due to her chronic pain.  Pain continues to be primarily around her lower back up to her neck and shoulders.  She also continues to have leg cramps in her thighs.  She reports the Flexeril is not helping the muscle cramps.  It did help in the past but is no longer working.  She has noted benefit with gabapentin and has noted improvement in her neck and back pain.  Gabapentin is not causing sedation or any other side effects at this time.  Interval History 09/28/22 Gloria Martin is here for follow-up regarding her chronic pain.  She reports gabapentin is helping her pain.  She takes 300 mg in the morning, 300 mg in the afternoon, and 100 mg at night.  Gabapentin does cause sedation at higher doses.  She also reports her afternoon dose wears off early and pain begins to return at this time.  She was not able to tolerate Robaxin and is back on the Flexeril.  She takes the Flexeril at night also.  Patient reports she tried TENS unit previously without benefit.   Pain Inventory Average Pain  9 Pain Right Now 9 My pain is tingling and aching  In the last 24 hours, has pain interfered with the following? General activity 7 Relation with others 5 Enjoyment of life 5 What TIME of day is your pain at its worst? morning  and evening Sleep (in general) Fair  Pain is worse with: walking, bending, and standing Pain improves with: rest and medication Relief from Meds: 5  Family History  Problem Relation Age of Onset   Hypertension Mother    High Cholesterol Mother    Heart disease Father    Hypertension Brother    Coronary artery disease Brother    Diabetes Maternal Aunt    Esophageal cancer Maternal Uncle    Heart disease Paternal Aunt    Diabetes Maternal Grandmother    Colon cancer Neg Hx    Colon polyps Neg Hx    Stomach cancer Neg Hx    Rectal cancer Neg Hx    Social History   Socioeconomic History   Marital status: Married    Spouse name: Not on file   Number of children: 1   Years of education: Not on file   Highest education level: Not on file  Occupational History   Not on file  Tobacco Use   Smoking status: Former    Packs/day: 1.00    Types: Cigarettes    Start date: 92  Quit date: 04/07/2020    Years since quitting: 2.4    Passive exposure: Never   Smokeless tobacco: Never  Vaping Use   Vaping Use: Never used  Substance and Sexual Activity   Alcohol use: No   Drug use: No   Sexual activity: Not on file  Other Topics Concern   Not on file  Social History Narrative   Not on file   Social Determinants of Health   Financial Resource Strain: Not on file  Food Insecurity: Not on file  Transportation Needs: Not on file  Physical Activity: Not on file  Stress: Not on file  Social Connections: Not on file   Past Surgical History:  Procedure Laterality Date   TUBAL LIGATION     WISDOM TOOTH EXTRACTION Bilateral    Past Surgical History:  Procedure Laterality Date   TUBAL LIGATION     WISDOM TOOTH EXTRACTION Bilateral    Past  Medical History:  Diagnosis Date   Bone spur of other site    Spine   Chest pain 08/09/2017   Chronic low back pain 12/24/2017   Cramps of lower extremity 01/26/2018   Essential hypertension 08/09/2017   GERD (gastroesophageal reflux disease)    High cholesterol    Hypertension    Kidney stones    Leg cramps    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   Mixed hyperlipidemia 04/06/2020   Obesity    Overweight 08/09/2017   Peripheral vascular disease ([place]) 08/17/2021   Prediabetes 08/11/2021   Ht 5' 4.5" (1.638 m)   Wt 260 lb (117.9 kg)   BMI 43.94 kg/m   Opioid Risk Score:   Fall Risk Score:  `1  Depression screen [facility] 2/9     07/25/2022   12:16 PM 05/29/2022   10:51 AM 01/30/2022    2:24 PM 10/20/2021    8:50 AM  Depression screen PHQ 2/9  Decreased Interest 0 0 2 0  Down, Depressed, Hopeless 0 0 0 1  PHQ - 2 Score 0 0 2 1  Altered sleeping   1   Tired, decreased energy   1   Change in appetite   1   Feeling bad or failure about yourself    0   Trouble concentrating   0   Moving slowly or fidgety/restless   0   Suicidal thoughts   0   PHQ-9 Score   5   Difficult doing work/chores   Somewhat difficult       Review of Systems  Musculoskeletal:  Positive for back pain, gait problem and neck pain.  All other systems reviewed and are negative.     Objective:   Physical Exam  Gen: no distress, normal appearing HEENT: oral mucosa pink and moist, NCAT Chest: normal effort, normal rate of breathing Abd: soft, non-distended Ext: no edema Psych: pleasant, normal affect Skin: intact Neuro: Cn 2-12 grossly intact , awake and alert Musculoskeletal: L and T spine paraspinal tenderness No focal neurological deficits noted  Mild tenderness in trapezius slump test neg  Kyphotic posture  Facet loading - positive Spurlings test negative   L spine Xray 03/30/21 FINDINGS: Five lumbar type vertebra. Alignment within normal limits. Minimal chronic appearing wedging of T12.  Moderate degenerative changes at T11-T12 and T12-L1. Mild degenerative osteophytes throughout the lumbar spine. Aortic atherosclerosis   IMPRESSION: Mild degenerative changes.   Xray T spine 03/30/21  FINDINGS: Thoracic alignment within normal limits. Vertebral body heights are maintained. Multiple level degenerative osteophyte with mild disc space  narrowing   IMPRESSION: Degenerative changes.  No acute osseous abnormality      Assessment & Plan:  61 year old female with chronic neck and back pain and muscle cramps in her bilateral lower extremities   1.  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 -Continue home exercise program.  Can consider repeat PT if it becomes available as an option.  She says this is limited by insurance and cost. -Discussed trying Lyrica-patient like to hold off on this for now -Continue gabapentin, will modified to 200 mg in the morning, 200 mg in the early afternoon, 200 mg in the evening, and 100 mg at night.  Hopefully this will provide more constant level of medication and limit sedation.  If this does not have the desired effect -Duloxetine may be a good option if different medication is needed -We will not start NSAIDs today due to history of gastric discomfort followed by GI, she is on PPI therapy currently    2.  Muscle spasms -Continue magnesium - stopped  -DC robaxin '500mg'$  TID prn- sedation -Restart flexeril

## 2022-10-06 ENCOUNTER — Other Ambulatory Visit: Payer: Self-pay | Admitting: Obstetrics and Gynecology

## 2022-10-06 DIAGNOSIS — N6489 Other specified disorders of breast: Secondary | ICD-10-CM

## 2022-11-28 ENCOUNTER — Encounter: Payer: Medicaid Other | Admitting: Physical Medicine & Rehabilitation

## 2022-11-30 ENCOUNTER — Encounter: Payer: Medicaid Other | Attending: Physical Medicine & Rehabilitation | Admitting: Physical Medicine & Rehabilitation

## 2022-11-30 ENCOUNTER — Emergency Department (HOSPITAL_COMMUNITY)
Admission: EM | Admit: 2022-11-30 | Discharge: 2022-11-30 | Disposition: A | Discharge status: Home / Self Care | Payer: Medicaid Other | Attending: Emergency Medicine | Admitting: Emergency Medicine

## 2022-11-30 ENCOUNTER — Encounter: Payer: Self-pay | Admitting: Physical Medicine & Rehabilitation

## 2022-11-30 VITALS — BP 147/103 | HR 95 | Ht 64.5 in | Wt 261.0 lb

## 2022-11-30 DIAGNOSIS — I10 Essential (primary) hypertension: Secondary | ICD-10-CM | POA: Diagnosis present

## 2022-11-30 DIAGNOSIS — M545 Low back pain, unspecified: Secondary | ICD-10-CM | POA: Diagnosis not present

## 2022-11-30 DIAGNOSIS — Z7982 Long term (current) use of aspirin: Secondary | ICD-10-CM | POA: Diagnosis not present

## 2022-11-30 DIAGNOSIS — M546 Pain in thoracic spine: Secondary | ICD-10-CM | POA: Diagnosis not present

## 2022-11-30 DIAGNOSIS — M62838 Other muscle spasm: Secondary | ICD-10-CM

## 2022-11-30 DIAGNOSIS — Z79899 Other long term (current) drug therapy: Secondary | ICD-10-CM | POA: Insufficient documentation

## 2022-11-30 DIAGNOSIS — M549 Dorsalgia, unspecified: Secondary | ICD-10-CM | POA: Insufficient documentation

## 2022-11-30 DIAGNOSIS — R079 Chest pain, unspecified: Secondary | ICD-10-CM

## 2022-11-30 DIAGNOSIS — M542 Cervicalgia: Secondary | ICD-10-CM | POA: Insufficient documentation

## 2022-11-30 DIAGNOSIS — R030 Elevated blood-pressure reading, without diagnosis of hypertension: Secondary | ICD-10-CM | POA: Diagnosis present

## 2022-11-30 DIAGNOSIS — G8929 Other chronic pain: Secondary | ICD-10-CM | POA: Diagnosis present

## 2022-11-30 LAB — CBC
HCT: 44.9 % (ref 36.0–46.0)
Hemoglobin: 14.9 g/dL (ref 12.0–15.0)
MCH: 30.7 pg (ref 26.0–34.0)
MCHC: 33.2 g/dL (ref 30.0–36.0)
MCV: 92.6 fL (ref 80.0–100.0)
Platelets: 334 10*3/uL (ref 150–400)
RBC: 4.85 MIL/uL (ref 3.87–5.11)
RDW: 13 % (ref 11.5–15.5)
WBC: 9.8 10*3/uL (ref 4.0–10.5)
nRBC: 0 % (ref 0.0–0.2)

## 2022-11-30 LAB — BASIC METABOLIC PANEL
Anion gap: 10 (ref 5–15)
BUN: 19 mg/dL (ref 6–20)
CO2: 24 mmol/L (ref 22–32)
Calcium: 9.2 mg/dL (ref 8.9–10.3)
Chloride: 102 mmol/L (ref 98–111)
Creatinine, Ser: 0.94 mg/dL (ref 0.44–1.00)
GFR, Estimated: >60 mL/min (ref >60)
Glucose, Bld: 141 mg/dL — ABNORMAL HIGH (ref 70–99)
Potassium: 4.3 mmol/L (ref 3.5–5.1)
Sodium: 136 mmol/L (ref 135–145)

## 2022-11-30 LAB — TROPONIN I (HIGH SENSITIVITY)
Troponin I (High Sensitivity): 4 ng/L (ref <18)
Troponin I (High Sensitivity): 5 ng/L (ref <18)

## 2022-11-30 LAB — MAGNESIUM: Magnesium: 2 mg/dL (ref 1.7–2.4)

## 2022-11-30 MED ORDER — PREGABALIN 50 MG PO CAPS: 50.0000 mg | ORAL_CAPSULE | Freq: Three times a day (TID) | ORAL | 0 refills | Status: DC

## 2022-11-30 NOTE — ED Notes (Signed)
Pt is a&ox4, ambulatory, pwd. Pt stated that she was a the pain clinic when told she needed to see her PCP regard her high BP. Pt decided instead of driving across town to PCP she would just come to ER. Pt denies any cp/shob or any other complaints other than her chronic pain.

## 2022-11-30 NOTE — ED Provider Notes (Signed)
[place] EMERGENCY DEPARTMENT AT [facility] Provider Note   CSN: [id number] Arrival date & time: 11/30/22  1241     History  Chief Complaint  Patient presents with   Hypertension    Gloria Martin is a 62 y.o. female.  62 year old presents due to increased blood pressure.  Patient started hypertension has been compliant with her medications.  Was at pain management today and had her medications because of her chronic neck and back pain.  Denies any anginal or CHF type symptoms.  Blood pressure there was 160/115.  Was told to follow-up with her doctor or come to the ER and patient told to come here instead.       Home Medications Prior to Admission medications   Medication Sig Start Date End Date Taking? Authorizing Provider  aspirin EC 81 MG tablet Take 81 mg by mouth daily. Swallow whole.    [provider]  cilostazol (PLETAL) 50 MG tablet Take 50 mg by mouth 2 (two) times daily. 07/14/22   [provider]  cyclobenzaprine (FLEXERIL) 10 MG tablet Take 10 mg by mouth at bedtime as needed. 07/28/22   [provider]  doxycycline (VIBRAMYCIN) 100 MG capsule Take 100 mg by mouth 2 (two) times daily. 08/14/22   [provider]  famotidine (PEPCID) 20 MG tablet Take 20 mg by mouth at bedtime.    [provider]  meloxicam (MOBIC) 15 MG tablet Take 1 tablet by mouth daily.    [provider]  pantoprazole (PROTONIX) 20 MG tablet Take 20 mg by mouth daily. 11/13/22   [provider]  pregabalin (LYRICA) 50 MG capsule Take 1 capsule (50 mg total) by mouth 3 (three) times daily. 11/30/22   Fanny Dance, MD  Rimegepant Sulfate (NURTEC) 75 MG TBDP TAKE 1 TABLET ON OR UNDER THE TONGUE AS NEEDED FOR MIGRAINE. MAX 1 TABLET PER DAY    [provider]  rosuvastatin (CRESTOR) 10 MG tablet Take 1 tablet every day by oral route for 90 days. 05/05/22   [provider]  SUMAtriptan (IMITREX) 50 MG tablet Take  1 tablet by mouth as needed.    [provider]  valsartan (DIOVAN) 160 MG tablet TAKE 1 TABLET BY MOUTH EVERY DAY 01/30/22   Rema Fendt, NP      Allergies    Patient has no known allergies.    Review of Systems   Review of Systems  All other systems reviewed and are negative.   Physical Exam Updated Vital Signs BP (!) 158/80 (BP Location: Left Arm)   Pulse (!) 106   Temp 98 F (36.7 C) (Oral)   Resp 16   SpO2 96%  Physical Exam Vitals and nursing note reviewed.  Constitutional:      General: She is not in acute distress.    Appearance: Normal appearance. She is well-developed. She is not toxic-appearing.  HENT:     Head: Normocephalic and atraumatic.  Eyes:     General: Lids are normal.     Conjunctiva/sclera: Conjunctivae normal.     Pupils: Pupils are equal, round, and reactive to light.  Neck:     Thyroid: No thyroid mass.     Trachea: No tracheal deviation.  Cardiovascular:     Rate and Rhythm: Normal rate and regular rhythm.     Heart sounds: Normal heart sounds. No murmur heard.    No gallop.  Pulmonary:     Effort: Pulmonary effort is normal. No respiratory  distress.     Breath sounds: Normal breath sounds. No stridor. No decreased breath sounds, wheezing, rhonchi or rales.  Abdominal:     General: There is no distension.     Palpations: Abdomen is soft.     Tenderness: There is no abdominal tenderness. There is no rebound.  Musculoskeletal:        General: No tenderness. Normal range of motion.     Cervical back: Normal range of motion and neck supple.  Skin:    General: Skin is warm and dry.     Findings: No abrasion or rash.  Neurological:     Mental Status: She is alert and oriented to person, place, and time. Mental status is at baseline.     GCS: GCS eye subscore is 4. GCS verbal subscore is 5. GCS motor subscore is 6.     Cranial Nerves: No cranial nerve deficit.     Sensory: No sensory deficit.     Motor: Motor function is intact.   Psychiatric:        Attention and Perception: Attention normal.        Speech: Speech normal.        Behavior: Behavior normal.     ED Results / Procedures / Treatments   Labs (all labs ordered are listed, but only abnormal results are displayed) Labs Reviewed  BASIC METABOLIC PANEL - Abnormal; Notable for the following components:      Result Value   Glucose, Bld 141 (*)    All other components within normal limits  CBC  MAGNESIUM  TROPONIN I (HIGH SENSITIVITY)  TROPONIN I (HIGH SENSITIVITY)    EKG EKG Interpretation  Date/Time:  Thursday Nov 30 2022 12:52:38 EDT Ventricular Rate:  94 PR Interval:  148 QRS Duration: 78 QT Interval:  350 QTC Calculation: 437 R Axis:   -16 Text Interpretation: Normal sinus rhythm Minimal voltage criteria for LVH, may be normal variant ( R in aVL ) Cannot rule out Anterior infarct , age undetermined Abnormal ECG When compared with ECG of 07-Apr-2020 15:38, PREVIOUS ECG IS PRESENT No significant change since last tracing Confirmed by Lorre Nick ([id number]) on 11/30/2022 4:42:36 PM  Radiology No results found.  Procedures Procedures    Medications Ordered in ED Medications - No data to display  ED Course/ Medical Decision Making/ A&P                             Medical Decision Making Patient is EKG per interpretation shows normal sinus rhythm.  No signs of acute ischemic changes noted.  Troponins negative x 2.  Repeat manual blood pressure was 150/80.  Would not make any changes to her antihypertensives at this time.  Will follow with her doctor         Final Clinical Impression(s) / ED Diagnoses Final diagnoses:  None    Rx / DC Orders ED Discharge Orders     None         Lorre Nick, MD 11/30/22 1705

## 2022-11-30 NOTE — Progress Notes (Addendum)
Subjective:    Patient ID: Gloria Martin, female    DOB: 1960-07-27, 62 y.o.   MRN: [id number]  HPI  HPI 05/29/22  Gloria Martin is a 62 year old female with past medical history of obesity, hypertension, prediabetes who is here for neck and back pain.  She was previously seen by Dr. Berline Chough.  She continues have pain in her back and neck.  She also has cramping pain in her legs and thighs.  Leg cramps are improved with Flexeril.  Previously meloxicam or other NSAIDs were helping his pain however she was told to discontinue NSAIDs by GI.  She recently tried physical therapy however this was stopped due to insurance issues.  She continues with home exercises that were recommended.  She is considering going to [facility] [place].  Tylenol does not provide significant benefit.  Her PCP is started gabapentin 100 mg 3 times daily however she has not tried this medication.   Interval History 07/25/22 Gloria Martin is here for follow-up due to her chronic pain.  Pain continues to be primarily around her lower back up to her neck and shoulders.  She also continues to have leg cramps in her thighs.  She reports the Flexeril is not helping the muscle cramps.  It did help in the past but is no longer working.  She has noted benefit with gabapentin and has noted improvement in her neck and back pain.  Gabapentin is not causing sedation or any other side effects at this time.   Interval History 09/28/22 Gloria Martin is here for follow-up regarding her chronic pain.  She reports gabapentin is helping her pain.  She takes 300 mg in the morning, 300 mg in the afternoon, and 100 mg at night.  Gabapentin does cause sedation at higher doses.  She also reports her afternoon dose wears off early and pain begins to return at this time.  She was not able to tolerate Robaxin and is back on the Flexeril.  She takes the Flexeril at night also.  Patient reports she tried TENS unit previously without benefit.    Interval History  11/30/22 Gloria Martin is here for chronic pain. She reports gabapentin 200mg  QID helping control her pain. It does cause mild sedation that limits dose escalation. BP noted to be elevated today, No other side effects with the medication. she also reports having chest pain L anterior chest. Denies any chest wall injury or tenderness.    Pain Inventory Average Pain 8 Pain Right Now 9 My pain is constant, sharp, burning, tingling, and aching  In the last 24 hours, has pain interfered with the following? General activity 8 Relation with others 9 Enjoyment of life 7 What TIME of day is your pain at its worst? daytime and night Sleep (in general) Fair  Pain is worse with: walking, bending, sitting, and standing Pain improves with: medication Relief from Meds: 7  Family History  Problem Relation Age of Onset   Hypertension Mother    High Cholesterol Mother    Heart disease Father    Hypertension Brother    Coronary artery disease Brother    Diabetes Maternal Aunt    Esophageal cancer Maternal Uncle    Heart disease Paternal Aunt    Diabetes Maternal Grandmother    Colon cancer Neg Hx    Colon polyps Neg Hx    Stomach cancer Neg Hx    Rectal cancer Neg Hx    Social History   Socioeconomic History   Marital  status: Married    Spouse name: Not on file   Number of children: 1   Years of education: Not on file   Highest education level: Not on file  Occupational History   Not on file  Tobacco Use   Smoking status: Former    Packs/day: 1    Types: Cigarettes    Start date: 57    Quit date: 04/07/2020    Years since quitting: 2.6    Passive exposure: Never   Smokeless tobacco: Never  Vaping Use   Vaping Use: Never used  Substance and Sexual Activity   Alcohol use: No   Drug use: No   Sexual activity: Not on file  Other Topics Concern   Not on file  Social History Narrative   Not on file   Social Determinants of Health   Financial Resource Strain: Not on file   Food Insecurity: Not on file  Transportation Needs: Not on file  Physical Activity: Not on file  Stress: Not on file  Social Connections: Not on file   Past Surgical History:  Procedure Laterality Date   TUBAL LIGATION     WISDOM TOOTH EXTRACTION Bilateral    Past Surgical History:  Procedure Laterality Date   TUBAL LIGATION     WISDOM TOOTH EXTRACTION Bilateral    Past Medical History:  Diagnosis Date   Bone spur of other site    Spine   Chest pain 08/09/2017   Chronic low back pain 12/24/2017   Cramps of lower extremity 01/26/2018   Essential hypertension 08/09/2017   GERD (gastroesophageal reflux disease)    High cholesterol    Hypertension    Kidney stones    Leg cramps    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   Mixed hyperlipidemia 04/06/2020   Obesity    Overweight 08/09/2017   Peripheral vascular disease ([facility]) 08/17/2021   Prediabetes 08/11/2021   BP (!) 147/103   Pulse 95   Ht 5' 4.5" (1.638 m)   Wt 261 lb (118.4 kg)   SpO2 95%   BMI 44.11 kg/m   Opioid Risk Score:   Fall Risk Score:  `1  Depression screen [facility] 2/9     11/30/2022   11:13 AM 11/30/2022   11:09 AM 07/25/2022   12:16 PM 05/29/2022   10:51 AM 01/30/2022    2:24 PM 10/20/2021    8:50 AM  Depression screen PHQ 2/9  Decreased Interest 1 1 0 0 2 0  Down, Depressed, Hopeless  1 0 0 0 1  PHQ - 2 Score 1 2 0 0 2 1  Altered sleeping     1   Tired, decreased energy     1   Change in appetite     1   Feeling bad or failure about yourself      0   Trouble concentrating     0   Moving slowly or fidgety/restless     0   Suicidal thoughts     0   PHQ-9 Score     5   Difficult doing work/chores     Somewhat difficult     Review of Systems  Cardiovascular:  Positive for chest pain.  Musculoskeletal:  Positive for back pain.       Pain in both legs   All other systems reviewed and are negative.     Objective:   Physical Exam    11/30/2022    4:56 PM 11/30/2022    3:57 PM 11/30/2022  12:57 PM   Vitals with BMI  Systolic 158 163 960  Diastolic 80 113 106  Pulse 106      Gen: no distress, normal appearing HEENT: oral mucosa pink and moist, NCAT Chest: normal effort, normal rate of breathing, no chest wall tenderness noted Abd: soft, non-distended Ext: no edema Psych: pleasant, normal affect Skin: intact Neuro: Cn 2-12 grossly intact , awake and alert Musculoskeletal: L and T spine paraspinal tenderness No focal neurological deficits noted  Mild tenderness in trapezius slump test neg  Kyphotic posture  Facet loading - positive Spurlings test negative   L spine Xray 03/30/21 FINDINGS: Five lumbar type vertebra. Alignment within normal limits. Minimal chronic appearing wedging of T12. Moderate degenerative changes at T11-T12 and T12-L1. Mild degenerative osteophytes throughout the lumbar spine. Aortic atherosclerosis   IMPRESSION: Mild degenerative changes.   Xray T spine 03/30/21  FINDINGS: Thoracic alignment within normal limits. Vertebral body heights are maintained. Multiple level degenerative osteophyte with mild disc space narrowing   IMPRESSION: Degenerative changes.  No acute osseous abnormality      Assessment & Plan:   62 year old female with chronic neck and back pain and muscle cramps in her bilateral lower extremities   1.  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 -Continue home exercise program.  Can consider repeat PT if it becomes available as an option.  She says this is limited by insurance and cost. -DC gabapentin 200mg  4 times a day and start lyrica 50mg  TID -Duloxetine may be a good option if different medication is needed -We will not start NSAIDs today due to history of gastric discomfort followed by GI, she is on PPI therapy currently     2.  Muscle spasms -Continue magnesium - stopped  -Robaxin 500mg  TID prn- was stopped prior visit due to sedation -Continue flexeril     3. HTN  -F/u with PCP  for HTN  4. Chest pain -No chest wall tenderness noted -Discussed ER evaluation- pt plans to go

## 2022-11-30 NOTE — ED Triage Notes (Signed)
Pt states she was a the pain clinic today and was told her BP was high with SBP of 158. Pt reports hx of HTN and takes medications at night, which she last took yesterday. Pt also endorses L sided non-radiating CP that is intermittent ongoing for some time.

## 2022-11-30 NOTE — ED Provider Triage Note (Signed)
Emergency Medicine Provider Triage Evaluation Note  Gloria Martin , a 62 y.o. female  was evaluated in triage.  Pt complains of elevated BP, mild headache, intermittent left sided chest pain. Went to her pain management doc today, takes gabapentin for neuropathy, and her BP was high. Was encouraged to go to her PCP, but she decided to come here. Has been on valsartan for HTN for years. Reports hx of low magnesium and wants this checked today  Review of Systems  Positive: As above Negative: Blurry vision, dizziness, SOB, leg swelling  Physical Exam  BP (!) 163/106   Pulse 96   Temp 98.3 F (36.8 C)   Resp 20   SpO2 96%  Gen:   Awake, no distress   Resp:  Normal effort  MSK:   Moves extremities without difficulty  Other:    Medical Decision Making  Medically screening exam initiated at 1:20 PM.  Appropriate orders placed.  Gloria Martin was informed that the remainder of the evaluation will be completed by another provider, this initial triage assessment does not replace that evaluation, and the importance of remaining in the ED until their evaluation is complete.  Workup initiated   Gloria Martin T, PA-C 11/30/22 1322

## 2022-11-30 NOTE — Discharge Instructions (Signed)
Follow-up with your doctor for blood pressure management

## 2022-12-08 ENCOUNTER — Telehealth: Payer: Self-pay | Admitting: *Deleted

## 2022-12-08 NOTE — Telephone Encounter (Signed)
Paitent states she left some paperwork for completion at last weeks appt. She is calling b/c it has a deadline on it.

## 2022-12-11 ENCOUNTER — Telehealth: Payer: Self-pay

## 2022-12-11 NOTE — Telephone Encounter (Signed)
Patient aware the form will be $25.00 to pick up.

## 2022-12-11 NOTE — Telephone Encounter (Signed)
Paperwork is completed. Patient has agreed to pick up the mail from the front desk. Copy sent to be scanned.

## 2023-02-01 ENCOUNTER — Ambulatory Visit: Payer: Medicaid Other | Attending: Nurse Practitioner | Admitting: Nurse Practitioner

## 2023-02-01 ENCOUNTER — Encounter: Payer: Self-pay | Admitting: Nurse Practitioner

## 2023-02-01 VITALS — BP 144/88 | HR 82 | Ht 65.0 in | Wt 267.2 lb

## 2023-02-01 DIAGNOSIS — I2510 Atherosclerotic heart disease of native coronary artery without angina pectoris: Secondary | ICD-10-CM | POA: Diagnosis not present

## 2023-02-01 DIAGNOSIS — Z6841 Body Mass Index (BMI) 40.0 and over, adult: Secondary | ICD-10-CM

## 2023-02-01 DIAGNOSIS — E785 Hyperlipidemia, unspecified: Secondary | ICD-10-CM

## 2023-02-01 DIAGNOSIS — I10 Essential (primary) hypertension: Secondary | ICD-10-CM | POA: Diagnosis not present

## 2023-02-01 MED ORDER — OLMESARTAN MEDOXOMIL 40 MG PO TABS: 40.0000 mg | ORAL_TABLET | Freq: Every day | ORAL | 3 refills | Status: DC

## 2023-02-01 NOTE — Patient Instructions (Addendum)
Medication Instructions:  Stop Valsartan as directed. Start Olmesartan 40 mg daily.   *If you need a refill on your cardiac medications before your next appointment, please call your pharmacy*   Lab Work: Your physician recommends that you return for lab work at your convenience. Fasting Lipid panel & CMET    Testing/Procedures: NONE ordered at this time of appointment     Follow-Up: At [facility], you and your health needs are our priority.  As part of our continuing mission to provide you with exceptional heart care, we have created designated Provider Care Teams.  These Care Teams include your primary Cardiologist (physician) and Advanced Practice Providers (APPs -  Physician Assistants and Nurse Practitioners) who all work together to provide you with the care you need, when you need it.  We recommend signing up for the patient portal called "MyChart".  Sign up information is provided on this After Visit Summary.  MyChart is used to connect with patients for Virtual Visits (Telemedicine).  Patients are able to view lab/test results, encounter notes, upcoming appointments, etc.  Non-urgent messages can be sent to your provider as well.   To learn more about what you can do with MyChart, go to [email or web address].    Your next appointment:   6 week(s)  Provider:   Bernadene Person, NP        Other Instructions Monitor blood pressure. Your goal BP is 130/80.

## 2023-02-01 NOTE — Progress Notes (Signed)
Office Visit    Patient Name: Gloria Martin Date of Encounter: 02/01/2023  Primary Care Provider:  Rema Fendt, NP Primary Cardiologist:  Peter Swaz[place], MD  Chief Complaint    62 year old female with a history of nonobstructive CAD, hypertension, hyperlipidemia, chronic neck and back pain, livedo reticularis, and obesity who presents for follow-up related to CAD and hypertension.  Past Medical History    Past Medical History:  Diagnosis Date   Bone spur of other site    Spine   Chest pain 08/09/2017   Chronic low back pain 12/24/2017   Cramps of lower extremity 01/26/2018   Essential hypertension 08/09/2017   GERD (gastroesophageal reflux disease)    High cholesterol    Hypertension    Kidney stones    Leg cramps    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   Mixed hyperlipidemia 04/06/2020   Obesity    Overweight 08/09/2017   Peripheral vascular disease (HCC) 08/17/2021   Prediabetes 08/11/2021   Past Surgical History:  Procedure Laterality Date   TUBAL LIGATION     WISDOM TOOTH EXTRACTION Bilateral     Allergies  No Known Allergies   Labs/Other Studies Reviewed    The following studies were reviewed today:  Cardiac Studies & Procedures     STRESS TESTS  MYOCARDIAL PERFUSION IMAGING 10/11/2017  Narrative  Nuclear stress EF: 66%.  There was no ST segment deviation noted during stress.  The study is normal.  This is a low risk study.  The left ventricular ejection fraction is hyperdynamic (>65%).  Appears to be normalization artifact with low counts No ischemia or infarct on gray scale images EF normal 66% No ischemia on ECG with stress   ECHOCARDIOGRAM  ECHOCARDIOGRAM COMPLETE 10/04/2017  Narrative *[facility]* [address] [place], [place] [address] [phone]  ------------------------------------------------------------------- Transthoracic Echocardiography  Patient:    Andria, Head MR #:       [id number] Study Date:  10/04/2017 Gender:     F Age:        56 Height:     172.7 cm Weight:     108 kg BSA:        2.32 m^2 Pt. Status: Room:  ATTENDING    Kristeen Miss, M.D. ORDERING     Belva Crome, MD REFERRING    Belva Crome, MD PERFORMING   [facility], Outpatient SONOGRAPHER  [facility], RDCS  cc:  ------------------------------------------------------------------- LV EF: 60% -   65%  ------------------------------------------------------------------- Indications:      Chest Pain (R07.9).  ------------------------------------------------------------------- History:   PMH:  Obesity, Edema.  Chest pain.  Dyspnea.  Risk factors:  Family history of coronary artery disease. Current tobacco use. Hypertension.  ------------------------------------------------------------------- Study Conclusions  - Left ventricle: The cavity size was normal. Wall thickness was normal. Systolic function was normal. The estimated ejection fraction was in the range of 60% to 65%. Wall motion was normal; there were no regional wall motion abnormalities. Features are consistent with a pseudonormal left ventricular filling pattern, with concomitant abnormal relaxation and increased filling pressure (grade 2 diastolic dysfunction).  ------------------------------------------------------------------- Study data:  No prior study was available for comparison.  Study status:  Routine.  Procedure:  Transthoracic echocardiography. Image quality was adequate.          Transthoracic echocardiography.  M-mode, complete 2D, spectral Doppler, and color Doppler.  Birthdate:  Patient birthdate: 1961-05-22.  Age:  Patient is 62 yr old.  Sex:  Gender: female.    BMI: 36.2 kg/m^2.  Blood  pressure:     160/104  Patient status:  Outpatient.  Study date: Study date: 10/04/2017. Study time: 07:42 AM.  Location:  [facility]  -------------------------------------------------------------------  ------------------------------------------------------------------- Left ventricle:  The cavity size was normal. Wall thickness was normal. Systolic function was normal. The estimated ejection fraction was in the range of 60% to 65%. Wall motion was normal; there were no regional wall motion abnormalities. Features are consistent with a pseudonormal left ventricular filling pattern, with concomitant abnormal relaxation and increased filling pressure (grade 2 diastolic dysfunction).  ------------------------------------------------------------------- Aortic valve:   Structurally normal valve.   Cusp separation was normal.  Doppler:  Transvalvular velocity was within the normal range. There was no stenosis. There was no regurgitation.  ------------------------------------------------------------------- Aorta:  Aortic root: The aortic root was normal in size. Ascending aorta: The ascending aorta was normal in size.  ------------------------------------------------------------------- Mitral valve:   Structurally normal valve.   Leaflet separation was normal.  Doppler:  Transvalvular velocity was within the normal range. There was no evidence for stenosis. There was no regurgitation.    Peak gradient (D): 3 mm Hg.  ------------------------------------------------------------------- Left atrium:  The atrium was normal in size.  ------------------------------------------------------------------- Right ventricle:  The cavity size was normal. Systolic function was normal.  ------------------------------------------------------------------- Pulmonic valve:    The valve appears to be grossly normal. Doppler:  There was no significant regurgitation.  ------------------------------------------------------------------- Tricuspid valve:   The valve appears to be grossly normal. Doppler:  There was trivial  regurgitation.  ------------------------------------------------------------------- Right atrium:  The atrium was normal in size.  ------------------------------------------------------------------- Pericardium:  There was no pericardial effusion.  ------------------------------------------------------------------- Measurements  Left ventricle                         Value        Reference LV ID, ED, PLAX chordal        (L)     23.3  mm     43 - 52 LV ID, ES, PLAX chordal        (L)     17.7  mm     23 - 38 LV fx shortening, PLAX chordal (L)     24    %      >=29 LV PW thickness, ED                    10.2  mm     ---------- IVS/LV PW ratio, ED                    1.06         <=1.3 Stroke volume, 2D                      83    ml     ---------- Stroke volume/bsa, 2D                  36    ml/m^2 ---------- LV e&', lateral                         9.14  cm/s   ---------- LV E/e&', lateral                       10.03        ---------- LV e&', medial  8.38  cm/s   ---------- LV E/e&', medial                        10.94        ---------- LV e&', average                         8.76  cm/s   ---------- LV E/e&', average                       10.47        ----------  Ventricular septum                     Value        Reference IVS thickness, ED                      10.8  mm     ----------  LVOT                                   Value        Reference LVOT ID, S                             22    mm     ---------- LVOT area                              3.8   cm^2   ---------- LVOT peak velocity, S                  96.8  cm/s   ---------- LVOT mean velocity, S                  62.7  cm/s   ---------- LVOT VTI, S                            21.9  cm     ----------  Aorta                                  Value        Reference Aortic root ID, ED                     28    mm     ---------- Ascending aorta ID, A-P, S             32    mm     ----------  Left atrium                             Value        Reference LA ID, A-P, ES                         38    mm     ---------- LA ID/bsa, A-P                         1.64  cm/m^2 <=2.2 LA volume, S                           40.7  ml     ---------- LA volume/bsa, S                       17.5  ml/m^2 ---------- LA volume, ES, 1-p A4C                 33    ml     ---------- LA volume/bsa, ES, 1-p A4C             14.2  ml/m^2 ---------- LA volume, ES, 1-p A2C                 47    ml     ---------- LA volume/bsa, ES, 1-p A2C             20.3  ml/m^2 ----------  Mitral valve                           Value        Reference Mitral E-wave peak velocity            91.7  cm/s   ---------- Mitral A-wave peak velocity            94.6  cm/s   ---------- Mitral deceleration time               225   ms     150 - 230 Mitral peak gradient, D                3     mm Hg  ---------- Mitral E/A ratio, peak                 1            ----------  Tricuspid valve                        Value        Reference Tricuspid regurg peak velocity         202   cm/s   ---------- Tricuspid peak RV-RA gradient          16    mm Hg  ----------  Right atrium                           Value        Reference RA ID, S-I, ES, A4C                    45.5  mm     34 - 49 RA area, ES, A4C                       13.4  cm^2   8.3 - 19.5 RA volume, ES, A/L                     32.2  ml     ---------- RA volume/bsa, ES, A/L                 13.9  ml/m^2 ----------  Right ventricle  Value        Reference TAPSE                                  24.2  mm     ---------- RV s&', lateral, S                      12.6  cm/s   ----------  Legend: (L)  and  (H)  mark values outside specified reference range.  ------------------------------------------------------------------- Prepared and Electronically Authenticated by  Kristeen Miss, M.D. 2019-03-14T13:33:33     CT SCANS  CT CORONARY MORPH W/CTA COR W/SCORE  01/17/2022  Addendum 01/17/2022 11:27 AM ADDENDUM REPORT: 01/17/2022 11:25  CLINICAL DATA:  Chest pain  EXAM: Cardiac CTA  MEDICATIONS: Sub lingual nitro. 4 mg and lopressor 100mg   TECHNIQUE: The patient was scanned on a [employer] 192 scanner. Gantry rotation speed was 250 msecs. Collimation was. 6 mm . A 120 kV prospective scan was triggered in the ascending thoracic aorta at 140 HU's with full mA between 30-70% of the R-R interval . Average HR during the scan was 41 bpm. The 3D data set was interpreted on a dedicated work station using MPR, MIP and VRT modes. A total of 80 cc of contrast was used.  FINDINGS: Non-cardiac: See separate report from [facility]. No significant findings on limited lung and soft tissue windows.  Calcium score: Noted in LAD and one punctate small area on proximal RCA  LM 0  RCA 0.61  LAD 82.9  LCX 0  Total 83.5 which is 88 th percentile for age / sex  Coronary Arteries: Right dominant with no anomalies  LM: Normal  LAD: 1-24% calcified plaque proximally 1-24% mixed plaque distally  D1: Normal  D2: Normal  Circumflex: Normal  OM1: Normal  OM2: Normal  RCA: Normal  PDA: Normal  PLA: Normal  IMPRESSION: 1.  Calcium score 83.5 which is 88 th percentile for age/sex  2.  CAD RADS 1 non obstructive CAD see description above  3.  Normal ascending thoracic aorta 3.2 cm  Charlton Haws   Electronically Signed By: Charlton Haws M.D. On: 01/17/2022 11:25  Narrative EXAM: OVER-READ INTERPRETATION  CT CHEST  The following report is a limited chest CT over-read performed by radiologist Dr. Trudie Reed of [facility] on 01/17/2022. The coronary calcium score and cardiac CTA interpretation by the cardiologist is attached.  COMPARISON:  None Available.  FINDINGS: Within the visualized portions of the thorax there are no suspicious appearing pulmonary nodules or masses, there is no  acute consolidative airspace disease, no pleural effusions, no pneumothorax and no lymphadenopathy. Visualized portions of the upper abdomen demonstrates diffuse low attenuation throughout the visualized hepatic parenchyma, indicative of a background of hepatic steatosis. There are no aggressive appearing lytic or blastic lesions noted in the visualized portions of the skeleton.  IMPRESSION: 1. Aortic Atherosclerosis (ICD10-I70.0). 2. Hepatic steatosis.  Electronically Signed: By: Trudie Reed M.D. On: 01/17/2022 08:56   CT SCANS  CT CARDIAC SCORING (SELF PAY ONLY) 12/22/2021  Addendum 12/22/2021  8:36 PM ADDENDUM REPORT: 12/22/2021 20:33  CLINICAL DATA:  Cardiovascular Disease Risk stratification  EXAM: Coronary Calcium Score  TECHNIQUE: A gated, non-contrast computed tomography scan of the heart was performed using 3mm slice thickness. Axial images were analyzed on a dedicated workstation. Calcium scoring of the coronary arteries was performed using the Agatston method.  FINDINGS: Coronary Calcium Score:  Left main: 0  Left anterior descending artery: 108  Left circumflex artery: 0  Right coronary artery: 0  Total: 108  Percentile: 90th  Pericardium: Normal.  Non-cardiac: See separate report from [facility].  IMPRESSION: Coronary calcium score of 108. This was 90th percentile for age-, race-, and sex-matched controls.  RECOMMENDATIONS: Coronary artery calcium (CAC) score is a strong predictor of incident coronary heart disease (CHD) and provides predictive information beyond traditional risk factors. CAC scoring is reasonable to use in the decision to withhold, postpone, or initiate statin therapy in intermediate-risk or selected borderline-risk asymptomatic adults (age 1-75 years and LDL-C >=70 to <190 mg/dL) who do not have diabetes or established atherosclerotic cardiovascular disease (ASCVD).* In intermediate-risk (10-year ASCVD risk  >=7.5% to <20%) adults or selected borderline-risk (10-year ASCVD risk >=5% to <7.5%) adults in whom a CAC score is measured for the purpose of making a treatment decision the following recommendations have been made:  If CAC=0, it is reasonable to withhold statin therapy and reassess in 5 to 10 years, as long as higher risk conditions are absent (diabetes mellitus, family history of premature CHD in first degree relatives (males <55 years; females <65 years), cigarette smoking, or LDL >=190 mg/dL).  If CAC is 1 to 99, it is reasonable to initiate statin therapy for patients >=90 years of age.  If CAC is >=100 or >=75th percentile, it is reasonable to initiate statin therapy at any age.  Cardiology referral should be considered for patients with CAC scores >=400 or >=75th percentile.  *2018 AHA/ACC/AACVPR/AAPA/ABC/ACPM/ADA/AGS/APhA/ASPC/NLA/PCNA Guideline on the Management of Blood Cholesterol: A Report of the American College of Cardiology/American Heart Association Task Force on Clinical Practice Guidelines. J Am Coll Cardiol. 2019;73(24):3168-3209.  Lennie Odor, MD   Electronically Signed By: Lennie Odor M.D. On: 12/22/2021 20:33  Narrative EXAM: OVER-READ INTERPRETATION  CT CHEST  The following report is a limited chest CT over-read performed by radiologist Dr. Trudie Reed of [facility] on 12/22/2021. The coronary calcium score interpretation by the cardiologist is attached.  COMPARISON:  None Available.  FINDINGS: Atherosclerotic calcifications in the thoracic aorta. Within the visualized portions of the thorax there are no suspicious appearing pulmonary nodules or masses, there is no acute consolidative airspace disease, no pleural effusions, no pneumothorax and no lymphadenopathy. Visualized portions of the upper abdomen demonstrates diffuse low attenuation throughout the visualized hepatic parenchyma, indicative of a background of  hepatic steatosis. There are no aggressive appearing lytic or blastic lesions noted in the visualized portions of the skeleton.  IMPRESSION: 1.  Aortic Atherosclerosis (ICD10-I70.0). 2. Hepatic steatosis.  Electronically Signed: By: Trudie Reed M.D. On: 12/22/2021 10:54         Recent Labs: 11/30/2022: BUN 19; Creatinine, Ser 0.94; Hemoglobin 14.9; Magnesium 2.0; Platelets 334; Potassium 4.3; Sodium 136  Recent Lipid Panel    Component Value Date/Time   CHOL 221 (H) 08/09/2017 1050   TRIG 136 08/09/2017 1050   HDL 62 08/09/2017 1050   CHOLHDL 3.6 08/09/2017 1050   LDLCALC 132 (H) 08/09/2017 1050   LDLDIRECT 123 (H) 10/30/2013 1806    History of Present Illness    62 year old female with the above past medical history including nonobstructive CAD, hypertension, hyperlipidemia, chronic neck and back pain, livedo reticularis, and obesity.  She has a history of chest pain that has resulted in multiple ED visits.  Stress test and echocardiogram in 2019 were normal.  She was last seen in the office on  11/09/2021 and reported ongoing intermittent chest discomfort.  CT calcium scoring in 01/10/2022 revealed coronary calcium score 108 (90 percentile).  Follow-up coronary CT angiogram revealed coronary calcium score of 83.5 (88th percentile), nonobstructive CAD.  She was evaluated in the ED on 11/30/2022 in the setting of elevated BP.  EKG was unremarkable, troponin was negative x 2.  No medication changes were made.  She was advised to follow-up with her doctor as an outpatient.  She presents today for follow-up accompanied by her husband.  Since her last visit she has been stable from a cardiac standpoint.  She notes occasional twinges in her chest that last for a second at a time and resolve spontaneously.  She denies any exertional symptoms concerning for angina.  Her BP has remained elevated.  Other than her concern for her ongoing elevated BP, she reports feeling well.  Home Medications     Current Outpatient Medications  Medication Sig Dispense Refill   aspirin EC 81 MG tablet Take 81 mg by mouth daily. Swallow whole.     cyclobenzaprine (FLEXERIL) 10 MG tablet Take 10 mg by mouth at bedtime as needed.     famotidine (PEPCID) 20 MG tablet Take 20 mg by mouth at bedtime.     gabapentin (NEURONTIN) 100 MG capsule Take 200 mg by mouth 4 (four) times daily.     olmesartan (BENICAR) 40 MG tablet Take 1 tablet (40 mg total) by mouth daily. 90 tablet 3   pantoprazole (PROTONIX) 20 MG tablet Take 20 mg by mouth daily.     rosuvastatin (CRESTOR) 10 MG tablet Take 1 tablet every day by oral route for 90 days.     SUMAtriptan (IMITREX) 50 MG tablet Take 1 tablet by mouth as needed.     cilostazol (PLETAL) 50 MG tablet Take 50 mg by mouth 2 (two) times daily.     doxycycline (VIBRAMYCIN) 100 MG capsule Take 100 mg by mouth 2 (two) times daily.     meloxicam (MOBIC) 15 MG tablet Take 1 tablet by mouth daily.     pregabalin (LYRICA) 50 MG capsule Take 1 capsule (50 mg total) by mouth 3 (three) times daily. 90 capsule 0   Rimegepant Sulfate (NURTEC) 75 MG TBDP TAKE 1 TABLET ON OR UNDER THE TONGUE AS NEEDED FOR MIGRAINE. MAX 1 TABLET PER DAY     No current facility-administered medications for this visit.     Review of Systems    She denies chest pain, palpitations, dyspnea, pnd, orthopnea, n, v, dizziness, syncope, edema, weight gain, or early satiety. All other systems reviewed and are otherwise negative except as noted above.   Physical Exam    VS:  BP (!) 144/88   Pulse 82   Ht 5\' 5"  (1.651 m)   Wt 267 lb 3.2 oz (121.2 kg)   SpO2 97%   BMI 44.46 kg/m  GEN: Well nourished, well developed, in no acute distress. HEENT: normal. Neck: Supple, no JVD, carotid bruits, or masses. Cardiac: RRR, no murmurs, rubs, or gallops. No clubbing, cyanosis, edema.  Radials/DP/PT 2+ and equal bilaterally.  Respiratory:  Respirations regular and unlabored, clear to auscultation  bilaterally. GI: Soft, nontender, nondistended, BS + x 4. MS: no deformity or atrophy. Skin: warm and dry, no rash. Neuro:  Strength and sensation are intact. Psych: Normal affect.  Accessory Clinical Findings    ECG personally reviewed by me today - EKG Interpretation Date/Time:  Thursday February 01 2023 15:29:50 EDT Ventricular Rate:  82 PR Interval:  150 QRS Duration:  80 QT Interval:  372 QTC Calculation: 434 R Axis:   -20  Text Interpretation: Normal sinus rhythm Minimal voltage criteria for LVH, may be normal variant ( R in aVL ) Cannot rule out Anterior infarct (cited on or before 30-Nov-2022) When compared with ECG of 30-Nov-2022 12:52, No significant change was found Confirmed by Bernadene Person ([id number]) on 02/01/2023 4:20:47 PM  - no acute changes.   Lab Results  Component Value Date   WBC 9.8 11/30/2022   HGB 14.9 11/30/2022   HCT 44.9 11/30/2022   MCV 92.6 11/30/2022   PLT 334 11/30/2022   Lab Results  Component Value Date   CREATININE 0.94 11/30/2022   BUN 19 11/30/2022   NA 136 11/30/2022   K 4.3 11/30/2022   CL 102 11/30/2022   CO2 24 11/30/2022   Lab Results  Component Value Date   ALT 38 07/09/2021   AST 50 (H) 07/09/2021   ALKPHOS 59 07/09/2021   BILITOT 2.1 (H) 07/09/2021   Lab Results  Component Value Date   CHOL 221 (H) 08/09/2017   HDL 62 08/09/2017   LDLCALC 132 (H) 08/09/2017   LDLDIRECT 123 (H) 10/30/2013   TRIG 136 08/09/2017   CHOLHDL 3.6 08/09/2017    No results found for: "HGBA1C"  Assessment & Plan   1. CAD: CT calcium scoring in 12/2021 revealed coronary calcium score 108 (90th percentile), follow-up coronary CT angiogram revealed coronary calcium score of 83.5 (88 percentile), nonobstructive CAD.  She notes occasional twinges in her chest that last for seconds at a time and resolve spontaneously, denies any exertional symptoms concerning for angina.  Continue Crestor.  2. Hypertension: BP remains elevated above goal.  Previously did  not tolerate hydrochlorothiazide due to leg cramping, therefore, would not likely be a candidate for chlorthalidone.  She was previously on amlodipine, however, this is discontinued for unclear reasons.  Will stop valsartan and start olmesartan 40 mg daily.  Continue to monitor BP and report BP consistently greater than 140/80.  If BP remains elevated above goal, consider reintroducing low-dose amlodipine versus addition of carvedilol.  3. Hyperlipidemia: No recent LDL on file.  Will update fasting lipids, CMET.  Continue Crestor.  4. Obesity: Continue to encourage ongoing lifestyle modifications with diet and exercise though her activity is somewhat limited in the setting of chronic pain.  5. Disposition: Follow-up in 6 weeks.  HYPERTENSION CONTROL Vitals:   02/01/23 1532 02/01/23 1645  BP: (!) 154/86 (!) 144/88    The patient's blood pressure is elevated above target today.  In order to address the patient's elevated BP: Blood pressure will be monitored at home to determine if medication changes need to be made.; A new medication was prescribed today.; Follow up with general cardiology has been recommended.      Joylene Grapes, NP 02/01/2023, 9:36 PM

## 2023-02-08 DIAGNOSIS — Z1231 Encounter for screening mammogram for malignant neoplasm of breast: Secondary | ICD-10-CM

## 2023-02-09 ENCOUNTER — Other Ambulatory Visit: Payer: Self-pay | Admitting: Physical Medicine & Rehabilitation

## 2023-02-09 ENCOUNTER — Other Ambulatory Visit: Payer: Self-pay | Admitting: Internal Medicine

## 2023-02-14 LAB — LIPID PANEL
Chol/HDL Ratio: 2.9 ratio (ref 0.0–4.4)
Cholesterol, Total: 134 mg/dL (ref 100–199)
HDL: 46 mg/dL (ref >39)
LDL Chol Calc (NIH): 67 mg/dL (ref 0–99)
Triglycerides: 117 mg/dL (ref 0–149)
VLDL Cholesterol Cal: 21 mg/dL (ref 5–40)

## 2023-03-01 ENCOUNTER — Encounter: Payer: Medicaid Other | Admitting: Physical Medicine & Rehabilitation

## 2023-03-01 ENCOUNTER — Encounter: Payer: Self-pay | Admitting: Physical Medicine & Rehabilitation

## 2023-03-01 ENCOUNTER — Encounter: Payer: Medicaid Other | Attending: Physical Medicine & Rehabilitation | Admitting: Physical Medicine & Rehabilitation

## 2023-03-01 VITALS — BP 129/83 | HR 78 | Ht 65.0 in | Wt 267.0 lb

## 2023-03-01 DIAGNOSIS — M545 Low back pain, unspecified: Secondary | ICD-10-CM

## 2023-03-01 DIAGNOSIS — I10 Essential (primary) hypertension: Secondary | ICD-10-CM | POA: Diagnosis not present

## 2023-03-01 DIAGNOSIS — M542 Cervicalgia: Secondary | ICD-10-CM | POA: Diagnosis present

## 2023-03-01 DIAGNOSIS — M62838 Other muscle spasm: Secondary | ICD-10-CM

## 2023-03-01 DIAGNOSIS — G8929 Other chronic pain: Secondary | ICD-10-CM

## 2023-03-01 MED ORDER — GABAPENTIN 100 MG PO CAPS: 200.0000 mg | ORAL_CAPSULE | Freq: Three times a day (TID) | ORAL | 2 refills | Status: AC

## 2023-03-01 NOTE — Progress Notes (Signed)
Subjective:    Patient ID: Gloria Martin, female    DOB: [birth year], 62 y.o.   MRN: [id number]  HPI  Pain Inventory Average Pain 8 Pain Right Now 4 My pain is constant, sharp, and aching  In the last 24 hours, has pain interfered with the following? General activity 6 Relation with others 6 Enjoyment of life 6 What TIME of day is your pain at its worst? evening Sleep (in general) Good  Pain is worse with: walking, bending, sitting, standing, and some activites Pain improves with: heat/ice and medication Relief from Meds: 5  Family History  Problem Relation Age of Onset   Hypertension Mother    High Cholesterol Mother    Heart disease Father    Hypertension Brother    Coronary artery disease Brother    Diabetes Maternal Aunt    Esophageal cancer Maternal Uncle    Heart disease Paternal Aunt    Diabetes Maternal Grandmother    Colon cancer Neg Hx    Colon polyps Neg Hx    Stomach cancer Neg Hx    Rectal cancer Neg Hx    Social History   Socioeconomic History   Marital status: Married    Spouse name: Not on file   Number of children: 1   Years of education: Not on file   Highest education level: Not on file  Occupational History   Not on file  Tobacco Use   Smoking status: Former    Current packs/day: 0.00    Average packs/day: 1 pack/day for 49.7 years (49.7 ttl pk-yrs)    Types: Cigarettes    Start date: 54    Quit date: 04/07/2020    Years since quitting: 2.8    Passive exposure: Never   Smokeless tobacco: Never  Vaping Use   Vaping status: Never Used  Substance and Sexual Activity   Alcohol use: No   Drug use: No   Sexual activity: Not on file  Other Topics Concern   Not on file  Social History Narrative   Not on file   Social Determinants of Health   Financial Resource Strain: Not on file  Food Insecurity: Not on file  Transportation Needs: Not on file  Physical Activity: Not on file  Stress: Not on file  Social Connections: Not on  file   Past Surgical History:  Procedure Laterality Date   TUBAL LIGATION     WISDOM TOOTH EXTRACTION Bilateral    Past Surgical History:  Procedure Laterality Date   TUBAL LIGATION     WISDOM TOOTH EXTRACTION Bilateral    Past Medical History:  Diagnosis Date   Bone spur of other site    Spine   Chest pain 08/09/2017   Chronic low back pain 12/24/2017   Cramps of lower extremity 01/26/2018   Essential hypertension 08/09/2017   GERD (gastroesophageal reflux disease)    High cholesterol    Hypertension    Kidney stones    Leg cramps    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   Mixed hyperlipidemia 04/06/2020   Obesity    Overweight 08/09/2017   Peripheral vascular disease (HCC) 08/17/2021   Prediabetes 08/11/2021   BP 129/83   Pulse 78   Ht 5\' 5"  (1.651 m)   Wt 267 lb (121.1 kg)   SpO2 94%   BMI 44.43 kg/m   Opioid Risk Score:   Fall Risk Score:  `1  Depression screen [facility] 2/9     03/01/2023   10:36 AM 11/30/2022  11:13 AM 11/30/2022   11:09 AM 07/25/2022   12:16 PM 05/29/2022   10:51 AM 01/30/2022    2:24 PM 10/20/2021    8:50 AM  Depression screen PHQ 2/9  Decreased Interest 0 1 1 0 0 2 0  Down, Depressed, Hopeless 0  1 0 0 0 1  PHQ - 2 Score 0 1 2 0 0 2 1  Altered sleeping      1   Tired, decreased energy      1   Change in appetite      1   Feeling bad or failure about yourself       0   Trouble concentrating      0   Moving slowly or fidgety/restless      0   Suicidal thoughts      0   PHQ-9 Score      5   Difficult doing work/chores      Somewhat difficult      Review of Systems  Constitutional: Negative.   HENT: Negative.    Eyes: Negative.   Respiratory: Negative.    Cardiovascular: Negative.   Gastrointestinal: Negative.   Endocrine: Negative.   Genitourinary: Negative.   Musculoskeletal:  Positive for back pain.  Skin: Negative.   Allergic/Immunologic: Negative.   Neurological: Negative.   Hematological: Negative.   Psychiatric/Behavioral:  Negative.    All other systems reviewed and are negative.      Objective:   Physical Exam        Assessment & Plan:

## 2023-03-01 NOTE — Progress Notes (Addendum)
Subjective:    Patient ID: Gloria Martin, female    DOB: Feb 05, 1961, 62 y.o.   MRN: [id number]  HPI  HPI 05/29/22  Gloria Martin is a 62 year old female with past medical history of obesity, hypertension, prediabetes who is here for neck and back pain.  She was previously seen by Dr. Berline Chough.  She continues have pain in her back and neck.  She also has cramping pain in her legs and thighs.  Leg cramps are improved with Flexeril.  Previously meloxicam or other NSAIDs were helping his pain however she was told to discontinue NSAIDs by GI.  She recently tried physical therapy however this was stopped due to insurance issues.  She continues with home exercises that were recommended.  She is considering going to a [facility].  Tylenol does not provide significant benefit.  Her PCP is started gabapentin 100 mg 3 times daily however she has not tried this medication.   Interval History 07/25/22 Gloria Martin is here for follow-up due to her chronic pain.  Pain continues to be primarily around her lower back up to her neck and shoulders.  She also continues to have leg cramps in her thighs.  She reports the Flexeril is not helping the muscle cramps.  It did help in the past but is no longer working.  She has noted benefit with gabapentin and has noted improvement in her neck and back pain.  Gabapentin is not causing sedation or any other side effects at this time.   Interval History 09/28/22 Gloria Martin is here for follow-up regarding her chronic pain.  She reports gabapentin is helping her pain.  She takes 300 mg in the morning, 300 mg in the afternoon, and 100 mg at night.  Gabapentin does cause sedation at higher doses.  She also reports her afternoon dose wears off early and pain begins to return at this time.  She was not able to tolerate Robaxin and is back on the Flexeril.  She takes the Flexeril at night also.  Patient reports she tried TENS unit previously without benefit.    Interval History  11/30/22 Gloria Martin is here for chronic pain. She reports gabapentin 200mg  QID helping control her pain. It does cause mild sedation that limits dose escalation. BP noted to be elevated today, No other side effects with the medication. she also reports having chest pain L anterior chest. Denies any chest wall injury or tenderness.    Interval History 03/01/23 Gloria Martin is here for follow-up regarding her chronic pain.  Patient discontinued Lyrica due to weight gain and has restarted gabapentin.  She is using the 300 mg in the morning, 300 mg in the afternoon and 200 mg at night.  She finds this regimen to be helping her pain and she feels like this is keeping her pain tolerable.  Her worst pain continues to be in her lower back however she also has pain in her shoulders and neck.  She has a TENS unit at home.  She is followed by cardiology who has adjusted her blood pressure medications.   Pain Inventory Average Pain 8 Pain Right Now 9 My pain is constant, sharp, burning, tingling, and aching  In the last 24 hours, has pain interfered with the following? General activity 8 Relation with others 9 Enjoyment of life 7 What TIME of day is your pain at its worst? daytime and night Sleep (in general) Fair  Pain is worse with: walking, bending, sitting, and standing Pain improves  with: medication Relief from Meds: 7  Family History  Problem Relation Age of Onset   Hypertension Mother    High Cholesterol Mother    Heart disease Father    Hypertension Brother    Coronary artery disease Brother    Diabetes Maternal Aunt    Esophageal cancer Maternal Uncle    Heart disease Paternal Aunt    Diabetes Maternal Grandmother    Colon cancer Neg Hx    Colon polyps Neg Hx    Stomach cancer Neg Hx    Rectal cancer Neg Hx    Social History   Socioeconomic History   Marital status: Married    Spouse name: Not on file   Number of children: 1   Years of education: Not on file   Highest  education level: Not on file  Occupational History   Not on file  Tobacco Use   Smoking status: Former    Current packs/day: 0.00    Average packs/day: 1 pack/day for 49.7 years (49.7 ttl pk-yrs)    Types: Cigarettes    Start date: 68    Quit date: 04/07/2020    Years since quitting: 2.8    Passive exposure: Never   Smokeless tobacco: Never  Vaping Use   Vaping status: Never Used  Substance and Sexual Activity   Alcohol use: No   Drug use: No   Sexual activity: Not on file  Other Topics Concern   Not on file  Social History Narrative   Not on file   Social Determinants of Health   Financial Resource Strain: Not on file  Food Insecurity: Not on file  Transportation Needs: Not on file  Physical Activity: Not on file  Stress: Not on file  Social Connections: Not on file   Past Surgical History:  Procedure Laterality Date   TUBAL LIGATION     WISDOM TOOTH EXTRACTION Bilateral    Past Surgical History:  Procedure Laterality Date   TUBAL LIGATION     WISDOM TOOTH EXTRACTION Bilateral    Past Medical History:  Diagnosis Date   Bone spur of other site    Spine   Chest pain 08/09/2017   Chronic low back pain 12/24/2017   Cramps of lower extremity 01/26/2018   Essential hypertension 08/09/2017   GERD (gastroesophageal reflux disease)    High cholesterol    Hypertension    Kidney stones    Leg cramps    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   Mixed hyperlipidemia 04/06/2020   Obesity    Overweight 08/09/2017   Peripheral vascular disease (HCC) 08/17/2021   Prediabetes 08/11/2021   BP 129/83   Pulse 78   Ht 5\' 5"  (1.651 m)   Wt 267 lb (121.1 kg)   SpO2 94%   BMI 44.43 kg/m   Opioid Risk Score:   Fall Risk Score:  `1  Depression screen [facility] 2/9     03/01/2023   10:36 AM 11/30/2022   11:13 AM 11/30/2022   11:09 AM 07/25/2022   12:16 PM 05/29/2022   10:51 AM 01/30/2022    2:24 PM 10/20/2021    8:50 AM  Depression screen PHQ 2/9  Decreased Interest 0 1 1 0 0 2 0   Down, Depressed, Hopeless 0  1 0 0 0 1  PHQ - 2 Score 0 1 2 0 0 2 1  Altered sleeping      1   Tired, decreased energy      1   Change in appetite  1   Feeling bad or failure about yourself       0   Trouble concentrating      0   Moving slowly or fidgety/restless      0   Suicidal thoughts      0   PHQ-9 Score      5   Difficult doing work/chores      Somewhat difficult     Review of Systems  Cardiovascular:  Positive for chest pain.  Musculoskeletal:  Positive for back pain.       Pain in both legs   All other systems reviewed and are negative.      Objective:   Physical Exam    03/01/2023   10:35 AM 02/01/2023    4:45 PM 02/01/2023    3:32 PM  Vitals with BMI  Height 5\' 5"   5\' 5"   Weight 267 lbs  267 lbs 3 oz  BMI 44.43  44.46  Systolic 129 144 409  Diastolic 83 88 86  Pulse 78  82    Gen: no distress, normal appearing HEENT: oral mucosa pink and moist, NCAT Chest: normal effort, normal rate of breathing Abd: soft, non-distended Ext: no edema Psych: pleasant, normal affect Skin: intact Neuro: Cn 2-12 grossly intact , awake and alert Musculoskeletal: L and T spine paraspinal tenderness, worse over L spine  Strength -no focal motor deficits in bilateral upper and lower extremities Mild tenderness in trapezius slump test neg bilaterally Kyphotic posture  Facet loading - positive again today Spurlings test negative   L spine Xray 03/30/21 FINDINGS: Five lumbar type vertebra. Alignment within normal limits. Minimal chronic appearing wedging of T12. Moderate degenerative changes at T11-T12 and T12-L1. Mild degenerative osteophytes throughout the lumbar spine. Aortic atherosclerosis   IMPRESSION: Mild degenerative changes.   Xray T spine 03/30/21  FINDINGS: Thoracic alignment within normal limits. Vertebral body heights are maintained. Multiple level degenerative osteophyte with mild disc space narrowing   IMPRESSION: Degenerative changes.  No acute  osseous abnormality      Assessment & Plan:   62 year old female with chronic neck and back pain and muscle cramps in her bilateral lower extremities   1.  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 -Continue home exercise program.  Can consider repeat PT if it becomes available as an option.  She says this is limited by insurance and cost-unchanged -Discontinue Lyrica and continue gabapentin.  She is currently taking gabapentin 300 mg in the morning, 300 mg in the afternoon, 200 mg at night.  She finds this to be helping her pain and somnolence has improved since she has been taking it more regularly.  Will continue this regimen for now. -Duloxetine may be a good option if different medication is needed-she would like to hold off on any additional medications today -Patient reports she used tramadol in the past which helped her pain however did cause her to feel sleepy at times -Will hold off on NSAIDs due to history of gastric discomfort -She has tens at home -Patient reports her worst pain is in her lower back, will order an MRI of L-spine.  She has evidence of spondylosis on her x-ray and positive facet joint loading test.  MRI may be beneficial for possible interventional options.   2.  Muscle spasms -Continue magnesium - stopped  -Robaxin 500mg  TID prn- was stopped prior visit due to sedation -Continue flexeril     3. HTN  -Improved, continue  to follow-up with cardiology and PCP  Called pt, her insurance declined MRI,she is unable to do formal PT due to cost, will send home exercise to try, 5 times a week Single knee to chest, pelvic tilt, cat-cow, press ups, bridges included exercises

## 2023-03-21 ENCOUNTER — Other Ambulatory Visit: Payer: Self-pay | Admitting: Obstetrics and Gynecology

## 2023-03-21 ENCOUNTER — Ambulatory Visit
Admission: RE | Admit: 2023-03-21 | Discharge: 2023-03-21 | Disposition: A | Discharge status: Home / Self Care | Payer: Medicaid Other | Source: Ambulatory Visit | Attending: Obstetrics and Gynecology | Admitting: Obstetrics and Gynecology

## 2023-03-21 DIAGNOSIS — R928 Other abnormal and inconclusive findings on diagnostic imaging of breast: Secondary | ICD-10-CM

## 2023-03-21 DIAGNOSIS — N6489 Other specified disorders of breast: Secondary | ICD-10-CM

## 2023-03-22 ENCOUNTER — Other Ambulatory Visit: Payer: Self-pay | Admitting: Obstetrics and Gynecology

## 2023-03-22 DIAGNOSIS — R928 Other abnormal and inconclusive findings on diagnostic imaging of breast: Secondary | ICD-10-CM

## 2023-03-29 ENCOUNTER — Ambulatory Visit: Payer: Medicaid Other | Attending: Nurse Practitioner | Admitting: Nurse Practitioner

## 2023-03-29 ENCOUNTER — Encounter: Payer: Self-pay | Admitting: Nurse Practitioner

## 2023-03-29 VITALS — BP 148/100 | HR 86 | Ht 65.0 in | Wt 266.8 lb

## 2023-03-29 DIAGNOSIS — Z6841 Body Mass Index (BMI) 40.0 and over, adult: Secondary | ICD-10-CM

## 2023-03-29 DIAGNOSIS — E785 Hyperlipidemia, unspecified: Secondary | ICD-10-CM | POA: Diagnosis not present

## 2023-03-29 DIAGNOSIS — I10 Essential (primary) hypertension: Secondary | ICD-10-CM

## 2023-03-29 DIAGNOSIS — I2510 Atherosclerotic heart disease of native coronary artery without angina pectoris: Secondary | ICD-10-CM | POA: Diagnosis not present

## 2023-03-29 MED ORDER — OLMESARTAN MEDOXOMIL 40 MG PO TABS: 40.0000 mg | ORAL_TABLET | Freq: Every day | ORAL | 1 refills | Status: DC

## 2023-03-29 NOTE — Progress Notes (Signed)
Office Visit    Patient Name: Gloria Martin Date of Encounter: 03/29/2023  Primary Care Provider:  Rema Fendt, NP Primary Cardiologist:  Peter Swaziland, MD  Chief Complaint    62 year old female with a history of nonobstructive CAD, hypertension, hyperlipidemia, chronic neck and back pain, livedo reticularis, and obesity who presents for follow-up related to CAD and hypertension.   Past Medical History    Past Medical History:  Diagnosis Date   Bone spur of other site    Spine   Chest pain 08/09/2017   Chronic low back pain 12/24/2017   Cramps of lower extremity 01/26/2018   Essential hypertension 08/09/2017   GERD (gastroesophageal reflux disease)    High cholesterol    Hypertension    Kidney stones    Leg cramps    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   Mixed hyperlipidemia 04/06/2020   Obesity    Overweight 08/09/2017   Peripheral vascular disease (HCC) 08/17/2021   Prediabetes 08/11/2021   Past Surgical History:  Procedure Laterality Date   TUBAL LIGATION     WISDOM TOOTH EXTRACTION Bilateral     Allergies  No Known Allergies   Labs/Other Studies Reviewed    The following studies were reviewed today:  Cardiac Studies & Procedures     STRESS TESTS  MYOCARDIAL PERFUSION IMAGING 10/11/2017  Narrative  Nuclear stress EF: 66%.  There was no ST segment deviation noted during stress.  The study is normal.  This is a low risk study.  The left ventricular ejection fraction is hyperdynamic (>65%).  Appears to be normalization artifact with low counts No ischemia or infarct on gray scale images EF normal 66% No ischemia on ECG with stress   ECHOCARDIOGRAM  ECHOCARDIOGRAM COMPLETE 10/04/2017  Narrative *[facility]* [address] [place], [place] [address] [phone]  ------------------------------------------------------------------- Transthoracic Echocardiography  Patient:    Gloria Martin, Gloria Martin MR #:       [id number] Study Date:  10/04/2017 Gender:     F Age:        56 Height:     172.7 cm Weight:     108 kg BSA:        2.32 m^2 Pt. Status: Room:  ATTENDING    Kristeen Miss, M.D. ORDERING     Belva Crome, MD REFERRING    Belva Crome, MD PERFORMING   [facility], Outpatient SONOGRAPHER  [facility], RDCS  cc:  ------------------------------------------------------------------- LV EF: 60% -   65%  ------------------------------------------------------------------- Indications:      Chest Pain (R07.9).  ------------------------------------------------------------------- History:   PMH:  Obesity, Edema.  Chest pain.  Dyspnea.  Risk factors:  Family history of coronary artery disease. Current tobacco use. Hypertension.  ------------------------------------------------------------------- Study Conclusions  - Left ventricle: The cavity size was normal. Wall thickness was normal. Systolic function was normal. The estimated ejection fraction was in the range of 60% to 65%. Wall motion was normal; there were no regional wall motion abnormalities. Features are consistent with a pseudonormal left ventricular filling pattern, with concomitant abnormal relaxation and increased filling pressure (grade 2 diastolic dysfunction).  ------------------------------------------------------------------- Study data:  No prior study was available for comparison.  Study status:  Routine.  Procedure:  Transthoracic echocardiography. Image quality was adequate.          Transthoracic echocardiography.  M-mode, complete 2D, spectral Doppler, and color Doppler.  Birthdate:  Patient birthdate: [birth year].  Age:  Patient is 62 yr old.  Sex:  Gender: female.    BMI: 36.2 kg/m^2.  Blood pressure:     160/104  Patient status:  Outpatient.  Study date: Study date: 10/04/2017. Study time: 07:42 AM.  Location:  [facility]  -------------------------------------------------------------------  ------------------------------------------------------------------- Left ventricle:  The cavity size was normal. Wall thickness was normal. Systolic function was normal. The estimated ejection fraction was in the range of 60% to 65%. Wall motion was normal; there were no regional wall motion abnormalities. Features are consistent with a pseudonormal left ventricular filling pattern, with concomitant abnormal relaxation and increased filling pressure (grade 2 diastolic dysfunction).  ------------------------------------------------------------------- Aortic valve:   Structurally normal valve.   Cusp separation was normal.  Doppler:  Transvalvular velocity was within the normal range. There was no stenosis. There was no regurgitation.  ------------------------------------------------------------------- Aorta:  Aortic root: The aortic root was normal in size. Ascending aorta: The ascending aorta was normal in size.  ------------------------------------------------------------------- Mitral valve:   Structurally normal valve.   Leaflet separation was normal.  Doppler:  Transvalvular velocity was within the normal range. There was no evidence for stenosis. There was no regurgitation.    Peak gradient (D): 3 mm Hg.  ------------------------------------------------------------------- Left atrium:  The atrium was normal in size.  ------------------------------------------------------------------- Right ventricle:  The cavity size was normal. Systolic function was normal.  ------------------------------------------------------------------- Pulmonic valve:    The valve appears to be grossly normal. Doppler:  There was no significant regurgitation.  ------------------------------------------------------------------- Tricuspid valve:   The valve appears to be grossly normal. Doppler:  There was trivial  regurgitation.  ------------------------------------------------------------------- Right atrium:  The atrium was normal in size.  ------------------------------------------------------------------- Pericardium:  There was no pericardial effusion.  ------------------------------------------------------------------- Measurements  Left ventricle                         Value        Reference LV ID, ED, PLAX chordal        (L)     23.3  mm     43 - 52 LV ID, ES, PLAX chordal        (L)     17.7  mm     23 - 38 LV fx shortening, PLAX chordal (L)     24    %      >=29 LV PW thickness, ED                    10.2  mm     ---------- IVS/LV PW ratio, ED                    1.06         <=1.3 Stroke volume, 2D                      83    ml     ---------- Stroke volume/bsa, 2D                  36    ml/m^2 ---------- LV e&', lateral                         9.14  cm/s   ---------- LV E/e&', lateral                       10.03        ---------- LV e&', medial  8.38  cm/s   ---------- LV E/e&', medial                        10.94        ---------- LV e&', average                         8.76  cm/s   ---------- LV E/e&', average                       10.47        ----------  Ventricular septum                     Value        Reference IVS thickness, ED                      10.8  mm     ----------  LVOT                                   Value        Reference LVOT ID, S                             22    mm     ---------- LVOT area                              3.8   cm^2   ---------- LVOT peak velocity, S                  96.8  cm/s   ---------- LVOT mean velocity, S                  62.7  cm/s   ---------- LVOT VTI, S                            21.9  cm     ----------  Aorta                                  Value        Reference Aortic root ID, ED                     28    mm     ---------- Ascending aorta ID, A-P, S             32    mm     ----------  Left atrium                             Value        Reference LA ID, A-P, ES                         38    mm     ---------- LA ID/bsa, A-P                         1.64  cm/m^2 <=2.2 LA volume, S                           40.7  ml     ---------- LA volume/bsa, S                       17.5  ml/m^2 ---------- LA volume, ES, 1-p A4C                 33    ml     ---------- LA volume/bsa, ES, 1-p A4C             14.2  ml/m^2 ---------- LA volume, ES, 1-p A2C                 47    ml     ---------- LA volume/bsa, ES, 1-p A2C             20.3  ml/m^2 ----------  Mitral valve                           Value        Reference Mitral E-wave peak velocity            91.7  cm/s   ---------- Mitral A-wave peak velocity            94.6  cm/s   ---------- Mitral deceleration time               225   ms     150 - 230 Mitral peak gradient, D                3     mm Hg  ---------- Mitral E/A ratio, peak                 1            ----------  Tricuspid valve                        Value        Reference Tricuspid regurg peak velocity         202   cm/s   ---------- Tricuspid peak RV-RA gradient          16    mm Hg  ----------  Right atrium                           Value        Reference RA ID, S-I, ES, A4C                    45.5  mm     34 - 49 RA area, ES, A4C                       13.4  cm^2   8.3 - 19.5 RA volume, ES, A/L                     32.2  ml     ---------- RA volume/bsa, ES, A/L                 13.9  ml/m^2 ----------  Right ventricle  Value        Reference TAPSE                                  24.2  mm     ---------- RV s&', lateral, S                      12.6  cm/s   ----------  Legend: (L)  and  (H)  mark values outside specified reference range.  ------------------------------------------------------------------- Prepared and Electronically Authenticated by  Kristeen Miss, M.D. 2019-03-14T13:33:33     CT SCANS  CT CORONARY MORPH W/CTA COR W/SCORE  01/17/2022  Addendum 01/17/2022 11:27 AM ADDENDUM REPORT: 01/17/2022 11:25  CLINICAL DATA:  Chest pain  EXAM: Cardiac CTA  MEDICATIONS: Sub lingual nitro. 4 mg and lopressor 100mg   TECHNIQUE: The patient was scanned on a [employer] 192 scanner. Gantry rotation speed was 250 msecs. Collimation was. 6 mm . A 120 kV prospective scan was triggered in the ascending thoracic aorta at 140 HU's with full mA between 30-70% of the R-R interval . Average HR during the scan was 41 bpm. The 3D data set was interpreted on a dedicated work station using MPR, MIP and VRT modes. A total of 80 cc of contrast was used.  FINDINGS: Non-cardiac: See separate report from [facility]. No significant findings on limited lung and soft tissue windows.  Calcium score: Noted in LAD and one punctate small area on proximal RCA  LM 0  RCA 0.61  LAD 82.9  LCX 0  Total 83.5 which is 88 th percentile for age / sex  Coronary Arteries: Right dominant with no anomalies  LM: Normal  LAD: 1-24% calcified plaque proximally 1-24% mixed plaque distally  D1: Normal  D2: Normal  Circumflex: Normal  OM1: Normal  OM2: Normal  RCA: Normal  PDA: Normal  PLA: Normal  IMPRESSION: 1.  Calcium score 83.5 which is 88 th percentile for age/sex  2.  CAD RADS 1 non obstructive CAD see description above  3.  Normal ascending thoracic aorta 3.2 cm  Charlton Haws   Electronically Signed By: Charlton Haws M.D. On: 01/17/2022 11:25  Narrative EXAM: OVER-READ INTERPRETATION  CT CHEST  The following report is a limited chest CT over-read performed by radiologist Dr. Trudie Reed of [facility] on 01/17/2022. The coronary calcium score and cardiac CTA interpretation by the cardiologist is attached.  COMPARISON:  None Available.  FINDINGS: Within the visualized portions of the thorax there are no suspicious appearing pulmonary nodules or masses, there is no  acute consolidative airspace disease, no pleural effusions, no pneumothorax and no lymphadenopathy. Visualized portions of the upper abdomen demonstrates diffuse low attenuation throughout the visualized hepatic parenchyma, indicative of a background of hepatic steatosis. There are no aggressive appearing lytic or blastic lesions noted in the visualized portions of the skeleton.  IMPRESSION: 1. Aortic Atherosclerosis (ICD10-I70.0). 2. Hepatic steatosis.  Electronically Signed: By: Trudie Reed M.D. On: 01/17/2022 08:56   CT SCANS  CT CARDIAC SCORING (SELF PAY ONLY) 12/22/2021  Addendum 12/22/2021  8:36 PM ADDENDUM REPORT: 12/22/2021 20:33  CLINICAL DATA:  Cardiovascular Disease Risk stratification  EXAM: Coronary Calcium Score  TECHNIQUE: A gated, non-contrast computed tomography scan of the heart was performed using 3mm slice thickness. Axial images were analyzed on a dedicated workstation. Calcium scoring of the coronary arteries was performed using the Agatston method.  FINDINGS: Coronary Calcium Score:  Left main: 0  Left anterior descending artery: 108  Left circumflex artery: 0  Right coronary artery: 0  Total: 108  Percentile: 90th  Pericardium: Normal.  Non-cardiac: See separate report from [facility].  IMPRESSION: Coronary calcium score of 108. This was 90th percentile for age-, race-, and sex-matched controls.  RECOMMENDATIONS: Coronary artery calcium (CAC) score is a strong predictor of incident coronary heart disease (CHD) and provides predictive information beyond traditional risk factors. CAC scoring is reasonable to use in the decision to withhold, postpone, or initiate statin therapy in intermediate-risk or selected borderline-risk asymptomatic adults (age 4-75 years and LDL-C >=70 to <190 mg/dL) who do not have diabetes or established atherosclerotic cardiovascular disease (ASCVD).* In intermediate-risk (10-year ASCVD risk  >=7.5% to <20%) adults or selected borderline-risk (10-year ASCVD risk >=5% to <7.5%) adults in whom a CAC score is measured for the purpose of making a treatment decision the following recommendations have been made:  If CAC=0, it is reasonable to withhold statin therapy and reassess in 5 to 10 years, as long as higher risk conditions are absent (diabetes mellitus, family history of premature CHD in first degree relatives (males <55 years; females <65 years), cigarette smoking, or LDL >=190 mg/dL).  If CAC is 1 to 99, it is reasonable to initiate statin therapy for patients >=53 years of age.  If CAC is >=100 or >=75th percentile, it is reasonable to initiate statin therapy at any age.  Cardiology referral should be considered for patients with CAC scores >=400 or >=75th percentile.  *2018 AHA/ACC/AACVPR/AAPA/ABC/ACPM/ADA/AGS/APhA/ASPC/NLA/PCNA Guideline on the Management of Blood Cholesterol: A Report of the American College of Cardiology/American Heart Association Task Force on Clinical Practice Guidelines. J Am Coll Cardiol. 2019;73(24):3168-3209.  Lennie Odor, MD   Electronically Signed By: Lennie Odor M.D. On: 12/22/2021 20:33  Narrative EXAM: OVER-READ INTERPRETATION  CT CHEST  The following report is a limited chest CT over-read performed by radiologist Dr. Trudie Reed of [facility] on 12/22/2021. The coronary calcium score interpretation by the cardiologist is attached.  COMPARISON:  None Available.  FINDINGS: Atherosclerotic calcifications in the thoracic aorta. Within the visualized portions of the thorax there are no suspicious appearing pulmonary nodules or masses, there is no acute consolidative airspace disease, no pleural effusions, no pneumothorax and no lymphadenopathy. Visualized portions of the upper abdomen demonstrates diffuse low attenuation throughout the visualized hepatic parenchyma, indicative of a background of  hepatic steatosis. There are no aggressive appearing lytic or blastic lesions noted in the visualized portions of the skeleton.  IMPRESSION: 1.  Aortic Atherosclerosis (ICD10-I70.0). 2. Hepatic steatosis.  Electronically Signed: By: Trudie Reed M.D. On: 12/22/2021 10:54         Recent Labs: 11/30/2022: BUN 19; Creatinine, Ser 0.94; Hemoglobin 14.9; Magnesium 2.0; Platelets 334; Potassium 4.3; Sodium 136  Recent Lipid Panel    Component Value Date/Time   CHOL 134 02/14/2023 0808   TRIG 117 02/14/2023 0808   HDL 46 02/14/2023 0808   CHOLHDL 2.9 02/14/2023 0808   LDLCALC 67 02/14/2023 0808   LDLDIRECT 123 (H) 10/30/2013 1806    History of Present Illness    62 year old female with the above past medical history including nonobstructive CAD, hypertension, hyperlipidemia, chronic neck and back pain, livedo reticularis, and obesity.   She has a history of chest pain that has resulted in multiple ED visits.  Stress test and echocardiogram in 2019 were normal.  CT calcium scoring in 01/10/2022 revealed coronary calcium score  108 (90 percentile).  Follow-up coronary CT angiogram revealed coronary calcium score of 83.5 (88th percentile), nonobstructive CAD.  She was evaluated in the ED on 11/30/2022 in the setting of elevated BP.  EKG was unremarkable, troponin was negative x 2.  No medication changes were made. She was last seen in the office on 02/01/2023 and was stable from a cardiac standpoint. Her BP was elevated. She was transitioned from losartan to olmesartan.    She presents today for follow-up.  Since her last visit she has been stable from a cardiac standpoint.  He notes that her BP has been well-controlled at home.  It is elevated in office today though she notes she just got off the phone from a stressful conversation with her mother.  She denies any symptoms concerning for angina.  Overall, she reports feeling well.  Home Medications    Current Outpatient Medications   Medication Sig Dispense Refill   aspirin EC 81 MG tablet Take 81 mg by mouth daily. Swallow whole.     cyclobenzaprine (FLEXERIL) 10 MG tablet Take 10 mg by mouth at bedtime as needed.     famotidine (PEPCID) 20 MG tablet Take 20 mg by mouth at bedtime.     gabapentin (NEURONTIN) 100 MG capsule Take 2-3 capsules (200-300 mg total) by mouth 3 (three) times daily. 720 capsule 2   pantoprazole (PROTONIX) 20 MG tablet Take 20 mg by mouth daily.     rosuvastatin (CRESTOR) 10 MG tablet Take 1 tablet every day by oral route for 90 days.     SUMAtriptan (IMITREX) 50 MG tablet Take 1 tablet by mouth as needed.     cilostazol (PLETAL) 50 MG tablet Take 50 mg by mouth 2 (two) times daily.     olmesartan (BENICAR) 40 MG tablet Take 1 tablet (40 mg total) by mouth daily. 180 tablet 1   Rimegepant Sulfate (NURTEC) 75 MG TBDP TAKE 1 TABLET ON OR UNDER THE TONGUE AS NEEDED FOR MIGRAINE. MAX 1 TABLET PER DAY     No current facility-administered medications for this visit.     Review of Systems    She denies chest pain, palpitations, dyspnea, pnd, orthopnea, n, v, dizziness, syncope, edema, weight gain, or early satiety. All other systems reviewed and are otherwise negative except as noted above.   Physical Exam    VS:  BP (!) 148/100   Pulse 86   Ht 5\' 5"  (1.651 m)   Wt 266 lb 12.8 oz (121 kg)   SpO2 96%   BMI 44.40 kg/m  GEN: Well nourished, well developed, in no acute distress. HEENT: normal. Neck: Supple, no JVD, carotid bruits, or masses. Cardiac: RRR, no murmurs, rubs, or gallops. No clubbing, cyanosis, edema.  Radials/DP/PT 2+ and equal bilaterally.  Respiratory:  Respirations regular and unlabored, clear to auscultation bilaterally. GI: Soft, nontender, nondistended, BS + x 4. MS: no deformity or atrophy. Skin: warm and dry, no rash. Neuro:  Strength and sensation are intact. Psych: Normal affect.  Accessory Clinical Findings    ECG personally reviewed by me today -    - no EKG in  office today.   Lab Results  Component Value Date   WBC 9.8 11/30/2022   HGB 14.9 11/30/2022   HCT 44.9 11/30/2022   MCV 92.6 11/30/2022   PLT 334 11/30/2022   Lab Results  Component Value Date   CREATININE 0.94 11/30/2022   BUN 19 11/30/2022   NA 136 11/30/2022   K 4.3 11/30/2022  CL 102 11/30/2022   CO2 24 11/30/2022   Lab Results  Component Value Date   ALT 38 07/09/2021   AST 50 (H) 07/09/2021   ALKPHOS 59 07/09/2021   BILITOT 2.1 (H) 07/09/2021   Lab Results  Component Value Date   CHOL 134 02/14/2023   HDL 46 02/14/2023   LDLCALC 67 02/14/2023   LDLDIRECT 123 (H) 10/30/2013   TRIG 117 02/14/2023   CHOLHDL 2.9 02/14/2023    No results found for: "HGBA1C"  Assessment & Plan    1. CAD: CT calcium scoring in 12/2021 revealed coronary calcium score 108 (90th percentile), follow-up coronary CT angiogram revealed coronary calcium score of 83.5 (88 percentile), nonobstructive CAD.  She notes occasional twinges in her chest that last for seconds at a time and resolve spontaneously, denies any exertional symptoms concerning for angina. Continue Aspirin, olmesartan, Crestor.   2. Hypertension: BP has reportedly been stable at home, though it is elevated in office today. She states this is likely in the setting of personal stress as she just finished a stressful phone call.  Previously did not tolerate hydrochlorothiazide due to leg cramping, therefore, would not likely be a candidate for chlorthalidone.  She was previously on amlodipine, however, this is discontinued for unclear reasons.    Continue to monitor BP and report BP consistently greater than 140/80.  If BP remains elevated above goal, consider reintroducing low-dose amlodipine versus addition of carvedilol.   3. Hyperlipidemia: LDL was 67 in 01/2023.  Continue Crestor.   4. Obesity: Continue to encourage ongoing lifestyle modifications with diet and exercise though her activity is somewhat limited in the setting of  chronic pain.   5. Disposition: Follow-up in 4-6 months, sooner if needed.   HYPERTENSION CONTROL Vitals:   03/29/23 1128 03/29/23 1150  BP: (!) 170/110 (!) 148/100    The patient's blood pressure is elevated above target today.  In order to address the patient's elevated BP: Blood pressure will be monitored at home to determine if medication changes need to be made.; The blood pressure is usually elevated in clinic.  Blood pressures monitored at home have been optimal.; Follow up with general cardiology has been recommended.      Joylene Grapes, NP 03/29/2023, 11:56 AM

## 2023-03-29 NOTE — Patient Instructions (Signed)
Medication Instructions:  Your physician recommends that you continue on your current medications as directed. Please refer to the Current Medication list given to you today. *If you need a refill on your cardiac medications before your next appointment, please call your pharmacy*   Lab Work: No Labs If you have labs (blood work) drawn today and your tests are completely normal, you will receive your results only by: MyChart Message (if you have MyChart) OR A paper copy in the mail If you have any lab test that is abnormal or we need to change your treatment, we will call you to review the results.   Testing/Procedures: No Test   Follow-Up: At [facility], you and your health needs are our priority.  As part of our continuing mission to provide you with exceptional heart care, we have created designated Provider Care Teams.  These Care Teams include your primary Cardiologist (physician) and Advanced Practice Providers (APPs -  Physician Assistants and Nurse Practitioners) who all work together to provide you with the care you need, when you need it.  We recommend signing up for the patient portal called "MyChart".  Sign up information is provided on this After Visit Summary.  MyChart is used to connect with patients for Virtual Visits (Telemedicine).  Patients are able to view lab/test results, encounter notes, upcoming appointments, etc.  Non-urgent messages can be sent to your provider as well.   To learn more about what you can do with MyChart, go to [email or web address].    Your next appointment:   4-6 month(s)  Provider:   Peter Swaziland, MD Or Bernadene Person DNP, AGNP-C  Other Instructions Monitor blood pressure at home. If blood pressure is consistently higher than 140/80 please notify office.

## 2023-03-31 ENCOUNTER — Other Ambulatory Visit: Payer: Medicaid Other

## 2023-04-16 ENCOUNTER — Ambulatory Visit: Payer: Medicaid Other | Admitting: Physical Medicine & Rehabilitation

## 2023-05-03 ENCOUNTER — Ambulatory Visit: Payer: Medicaid Other | Admitting: Physical Medicine & Rehabilitation

## 2023-05-24 ENCOUNTER — Other Ambulatory Visit: Payer: Self-pay | Admitting: Internal Medicine

## 2023-07-23 NOTE — Progress Notes (Signed)
 Cardiology Office Note:    Date:  07/30/2023   ID:  Gloria Martin, DOB 1960/08/25, MRN [id number]  PCP:  Lorren Greig PARAS, NP   [facility] Providers Cardiologist:  Sebron Mcmahill, MD     Referring MD: Lorren Greig PARAS, NP   Chief Complaint  Patient presents with   Follow-up    4-6 months.   Coronary Artery Disease    History of Present Illness:    Gloria Martin is a 62 y.o. female seen for follow up of CAD. She is a former patient of Dr Victory Sharps. She has past medical history including nonobstructive CAD, hypertension, hyperlipidemia, chronic neck and back pain, livedo reticularis, and obesity.   She has a history of chest pain that has resulted in multiple ED visits.  Stress test and echocardiogram in 2019 were normal.  CT calcium  scoring in 01/10/2022 revealed coronary calcium  score 108 (90 percentile).  Follow-up coronary CT angiogram revealed coronary calcium  score of 83.5 (88th percentile), nonobstructive CAD.  She was evaluated in the ED on 11/30/2022 in the setting of elevated BP.  EKG was unremarkable, troponin was negative x 2.  No medication changes were made. She was last seen in the office on 02/01/2023 and was stable from a cardiac standpoint. Her BP was elevated. She was transitioned from valsartan  to olmesartan .   She reports that  the switch in her medication her BP isn't doing as well and she has more frequent HA. Notes taking HCT in the past and this resulted in more leg cramps. Did not tolerate 320 mg dose of valsartan  before. She does have chronic back and leg pain that limits her activity. LE dopplers were normal in 2022. Reports BP at home typically 120-125 systolic.     Past Medical History:  Diagnosis Date   Bone spur of other site    Spine   Chest pain 08/09/2017   Chronic low back pain 12/24/2017   Cramps of lower extremity 01/26/2018   Essential hypertension 08/09/2017   GERD (gastroesophageal reflux disease)    High cholesterol    Hypertension     Kidney stones    Leg cramps    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   Mixed hyperlipidemia 04/06/2020   Obesity    Overweight 08/09/2017   Peripheral vascular disease (HCC) 08/17/2021   Prediabetes 08/11/2021    Past Surgical History:  Procedure Laterality Date   TUBAL LIGATION     WISDOM TOOTH EXTRACTION Bilateral     Current Medications: Current Meds  Medication Sig   aspirin EC 81 MG tablet Take 81 mg by mouth daily. Swallow whole.   cyclobenzaprine (FLEXERIL) 10 MG tablet Take 10 mg by mouth at bedtime as needed.   famotidine  (PEPCID ) 20 MG tablet Take 20 mg by mouth at bedtime.   gabapentin  (NEURONTIN ) 100 MG capsule Take 2-3 capsules (200-300 mg total) by mouth 3 (three) times daily.   pantoprazole  (PROTONIX ) 20 MG tablet Take 20 mg by mouth daily.   rosuvastatin  (CRESTOR ) 10 MG tablet Take 1 tablet every day by oral route for 90 days.   SUMAtriptan (IMITREX) 50 MG tablet Take 1 tablet by mouth as needed.   valsartan  (DIOVAN ) 160 MG tablet Take 1 tablet (160 mg total) by mouth daily.   [DISCONTINUED] olmesartan  (BENICAR ) 40 MG tablet Take 1 tablet (40 mg total) by mouth daily.     Allergies:   Patient has no known allergies.   Social History   Socioeconomic History   Marital  status: Married    Spouse name: Not on file   Number of children: 1   Years of education: Not on file   Highest education level: Not on file  Occupational History   Not on file  Tobacco Use   Smoking status: Former    Current packs/day: 0.00    Average packs/day: 1 pack/day for 49.7 years (49.7 ttl pk-yrs)    Types: Cigarettes    Start date: 41    Quit date: 04/07/2020    Years since quitting: 3.3    Passive exposure: Never   Smokeless tobacco: Never  Vaping Use   Vaping status: Never Used  Substance and Sexual Activity   Alcohol use: No   Drug use: No   Sexual activity: Not on file  Other Topics Concern   Not on file  Social History Narrative   Not on file   Social Drivers of  Health   Financial Resource Strain: Not on file  Food Insecurity: Not on file  Transportation Needs: Not on file  Physical Activity: Not on file  Stress: Not on file  Social Connections: Not on file     Family History: The patient's family history includes Coronary artery disease in her brother; Diabetes in her maternal aunt and maternal grandmother; Esophageal cancer in her maternal uncle; Heart disease in her father and paternal aunt; High Cholesterol in her mother; Hypertension in her brother and mother. There is no history of Colon cancer, Colon polyps, Stomach cancer, or Rectal cancer.  ROS:   Please see the history of present illness.     All other systems reviewed and are negative.  EKGs/Labs/Other Studies Reviewed:    The following studies were reviewed today:  EKG Interpretation Date/Time:  Monday July 30 2023 11:19:46 EST Ventricular Rate:  99 PR Interval:  142 QRS Duration:  82 QT Interval:  346 QTC Calculation: 444 R Axis:   -30  Text Interpretation: Normal sinus rhythm Left axis deviation Minimal voltage criteria for LVH, may be normal variant ( R in aVL ) Possible Anterior infarct (cited on or before 30-Nov-2022) When compared with ECG of 01-Feb-2023 15:29, No significant change was found Confirmed by Nicha Hemann [phone]) on 07/30/2023 11:21:07 AM   Coronary CTA 01/17/22: FINDINGS: Non-cardiac: See separate report from [facility]. No significant findings on limited lung and soft tissue windows.   Calcium  score: Noted in LAD and one punctate small area on proximal RCA   LM 0   RCA 0.61   LAD 82.9   LCX 0   Total 83.5 which is 88 th percentile for age / sex   Coronary Arteries: Right dominant with no anomalies   LM: Normal   LAD: 1-24% calcified plaque proximally 1-24% mixed plaque distally   D1: Normal   D2: Normal   Circumflex: Normal   OM1: Normal   OM2: Normal   RCA: Normal   PDA: Normal   PLA: Normal   IMPRESSION: 1.   Calcium  score 83.5 which is 88 th percentile for age/sex   2.  CAD RADS 1 non obstructive CAD see description above   3.  Normal ascending thoracic aorta 3.2 cm   Gloria Martin     EKG Interpretation Date/Time:  Monday July 30 2023 11:19:46 EST Ventricular Rate:  99 PR Interval:  142 QRS Duration:  82 QT Interval:  346 QTC Calculation: 444 R Axis:   -30  Text Interpretation: Normal sinus rhythm Left axis deviation Minimal voltage criteria for LVH, may  be normal variant ( R in aVL ) Possible Anterior infarct (cited on or before 30-Nov-2022) When compared with ECG of 01-Feb-2023 15:29, No significant change was found Confirmed by Gaylyn Berish [phone]) on 07/30/2023 11:21:07 AM    Recent Labs: 11/30/2022: BUN 19; Creatinine, Ser 0.94; Hemoglobin 14.9; Magnesium 2.0; Platelets 334; Potassium 4.3; Sodium 136  Recent Lipid Panel    Component Value Date/Time   CHOL 134 02/14/2023 0808   TRIG 117 02/14/2023 0808   HDL 46 02/14/2023 0808   CHOLHDL 2.9 02/14/2023 0808   LDLCALC 67 02/14/2023 0808   LDLDIRECT 123 (H) 10/30/2013 1806     Risk Assessment/Calculations:             Physical Exam:    VS:  BP (!) 140/102 (BP Location: Left Arm, Patient Position: Sitting, Cuff Size: Large)   Pulse (!) 105   Ht 5' 5 (1.651 m)   Wt 265 lb (120.2 kg)   SpO2 97%   BMI 44.10 kg/m     Wt Readings from Last 3 Encounters:  07/30/23 265 lb (120.2 kg)  03/29/23 266 lb 12.8 oz (121 kg)  03/01/23 267 lb (121.1 kg)     GEN:  Well nourished, obese in no acute distress HEENT: Normal NECK: No JVD; No carotid bruits LYMPHATICS: No lymphadenopathy CARDIAC: RRR, no murmurs, rubs, gallops RESPIRATORY:  Clear to auscultation without rales, wheezing or rhonchi  ABDOMEN: Soft, non-tender, non-distended MUSCULOSKELETAL:  No edema; No deformity  SKIN: Warm and dry NEUROLOGIC:  Alert and oriented x 3 PSYCHIATRIC:  Normal affect   ASSESSMENT:    1. Tachycardia   2. Coronary artery disease  involving native coronary artery of native heart without angina pectoris   3. Essential hypertension   4. Hyperlipidemia LDL goal <70   5. Class 3 severe obesity with body mass index (BMI) of 40.0 to 44.9 in adult, unspecified obesity type, unspecified whether serious comorbidity present (HCC)    PLAN:    In order of problems listed above:  HTN. BP is elevated today but she reports better readings at home. She would prefer to switch back to valsartan  so will change to 160 mg daily and stop olmesartan . Did not tolerate HCT in the past. I asked her to keep a BP diary and review with PCP in March. If BP still elevated would consider adding Bystolic or Coreg as next choice.  CAD nonobstructive based on CTA. No anginal symptoms. Continue risk factor modification with ASA, statin, BP control HLD on Crestor  LDL 67            Medication Adjustments/Labs and Tests Ordered: Current medicines are reviewed at length with the patient today.  Concerns regarding medicines are outlined above.  Orders Placed This Encounter  Procedures   EKG 12-Lead   Meds ordered this encounter  Medications   valsartan  (DIOVAN ) 160 MG tablet    Sig: Take 1 tablet (160 mg total) by mouth daily.    Dispense:  90 tablet    Refill:  3    There are no Patient Instructions on file for this visit.   Signed, Saba Gomm, MD  07/30/2023 11:40 AM    [facility]

## 2023-07-30 ENCOUNTER — Encounter: Payer: Self-pay | Admitting: Cardiology

## 2023-07-30 ENCOUNTER — Ambulatory Visit: Payer: Medicare Other | Attending: Cardiology | Admitting: Cardiology

## 2023-07-30 VITALS — BP 140/102 | HR 105 | Ht 65.0 in | Wt 265.0 lb

## 2023-07-30 DIAGNOSIS — I2510 Atherosclerotic heart disease of native coronary artery without angina pectoris: Secondary | ICD-10-CM | POA: Diagnosis not present

## 2023-07-30 DIAGNOSIS — E785 Hyperlipidemia, unspecified: Secondary | ICD-10-CM | POA: Diagnosis not present

## 2023-07-30 DIAGNOSIS — Z6841 Body Mass Index (BMI) 40.0 and over, adult: Secondary | ICD-10-CM

## 2023-07-30 DIAGNOSIS — E66813 Obesity, class 3: Secondary | ICD-10-CM | POA: Diagnosis present

## 2023-07-30 DIAGNOSIS — R000 Tachycardia, unspecified: Secondary | ICD-10-CM | POA: Diagnosis not present

## 2023-07-30 DIAGNOSIS — I10 Essential (primary) hypertension: Secondary | ICD-10-CM

## 2023-07-30 MED ORDER — VALSARTAN 160 MG PO TABS: 160.0000 mg | ORAL_TABLET | Freq: Every day | ORAL | 3 refills | Status: AC

## 2023-07-30 NOTE — Patient Instructions (Addendum)
 Medication Instructions:  Stop Olmesartan  Start Valsartan  160 mg daily Continue all other medications *If you need a refill on your cardiac medications before your next appointment, please call your pharmacy*   Lab Work: None ordered   Testing/Procedures: None ordered   Follow-Up: At [facility], you and your health needs are our priority.  As part of our continuing mission to provide you with exceptional heart care, we have created designated Provider Care Teams.  These Care Teams include your primary Cardiologist (physician) and Advanced Practice Providers (APPs -  Physician Assistants and Nurse Practitioners) who all work together to provide you with the care you need, when you need it.  We recommend signing up for the patient portal called MyChart.  Sign up information is provided on this After Visit Summary.  MyChart is used to connect with patients for Virtual Visits (Telemedicine).  Patients are able to view lab/test results, encounter notes, upcoming appointments, etc.  Non-urgent messages can be sent to your provider as well.   To learn more about what you can do with MyChart, go to [email or web address].    Your next appointment:  1 year    Call in Oct to schedule Jan appointment     Provider:  Dr.Jordan

## 2023-09-24 ENCOUNTER — Ambulatory Visit
Admission: RE | Admit: 2023-09-24 | Discharge: 2023-09-24 | Disposition: A | Discharge status: Home / Self Care | Payer: Medicare Other | Source: Ambulatory Visit | Attending: Obstetrics and Gynecology | Admitting: Obstetrics and Gynecology

## 2023-09-24 DIAGNOSIS — R928 Other abnormal and inconclusive findings on diagnostic imaging of breast: Secondary | ICD-10-CM

## 2024-02-06 ENCOUNTER — Ambulatory Visit (HOSPITAL_BASED_OUTPATIENT_CLINIC_OR_DEPARTMENT_OTHER): Admitting: Orthopaedic Surgery

## 2024-03-10 ENCOUNTER — Other Ambulatory Visit: Payer: Self-pay | Admitting: Physical Medicine & Rehabilitation

## 2024-05-26 ENCOUNTER — Encounter: Payer: Self-pay | Admitting: Radiology

## 2024-07-30 ENCOUNTER — Encounter: Payer: Self-pay | Admitting: Cardiology

## 2024-08-20 ENCOUNTER — Other Ambulatory Visit: Payer: Self-pay

## 2024-08-20 DIAGNOSIS — R1030 Lower abdominal pain, unspecified: Secondary | ICD-10-CM

## 2024-08-25 ENCOUNTER — Inpatient Hospital Stay: Admission: RE | Admit: 2024-08-25 | Source: Ambulatory Visit

## 2024-09-02 ENCOUNTER — Other Ambulatory Visit

## 2024-09-04 ENCOUNTER — Other Ambulatory Visit
